# Patient Record
Sex: Female | Born: 1970 | Race: White | Hispanic: No | Marital: Married | Smoking: Never smoker
Health system: Southern US, Community
[De-identification: ages and names within clinical notes are randomized; demographics above are authoritative.]

## PROBLEM LIST (undated history)

## (undated) HISTORY — PX: HAND SURGERY: SHX662

## (undated) HISTORY — PX: BREAST REDUCTION SURGERY: SHX8

## (undated) HISTORY — PX: TONSILLECTOMY: SUR1361

---

## 2009-08-27 ENCOUNTER — Ambulatory Visit
Admit: 2009-08-27 | Discharge: 2009-08-27 | Disposition: A | Discharge status: Home / Self Care | Payer: Self-pay | Source: Ambulatory Visit | Attending: Obstetrics and Gynecology | Admitting: Obstetrics and Gynecology

## 2009-09-01 LAB — HPV DNA PROBE WITH CYTOLOGY: HPV Hybrid Capture: NEGATIVE

## 2009-09-02 LAB — GYN CYTOLOGY

## 2009-09-08 ENCOUNTER — Ambulatory Visit
Admit: 2009-09-08 | Discharge: 2009-09-08 | Disposition: A | Discharge status: Home / Self Care | Payer: Self-pay | Source: Ambulatory Visit | Attending: Obstetrics and Gynecology | Admitting: Obstetrics and Gynecology

## 2009-09-13 LAB — VITAMIN D
25-OH VIT D2: <4 ng/mL
25-OH VIT D3: 21 ng/mL
25-OH Vit Total: 21 ng/mL — ABNORMAL LOW (ref 30–80)

## 2010-09-14 ENCOUNTER — Ambulatory Visit
Admit: 2010-09-14 | Discharge: 2010-09-14 | Disposition: A | Discharge status: Home / Self Care | Payer: Self-pay | Source: Ambulatory Visit | Admitting: Obstetrics and Gynecology

## 2010-09-16 LAB — GYN CYTOLOGY

## 2011-03-07 ENCOUNTER — Ambulatory Visit: Payer: Self-pay | Admitting: Allergy/Immunology/Rheumatology

## 2011-04-07 ENCOUNTER — Ambulatory Visit
Admit: 2011-04-07 | Discharge: 2011-04-07 | Disposition: A | Discharge status: Home / Self Care | Payer: Self-pay | Source: Ambulatory Visit | Attending: Obstetrics and Gynecology | Admitting: Obstetrics and Gynecology

## 2011-04-07 LAB — HIV-1 AND 2 AB: HIV 1&2 Ab screen: NEGATIVE

## 2011-04-07 LAB — TYPE AND SCREEN FOR PNP
ABO RH Blood Type: B POS
Antibody Screen: NEGATIVE

## 2011-04-07 LAB — HEPATITIS C ANTIBODY: Hep C Ab: NEGATIVE

## 2011-04-07 LAB — GLUCOSE: Glucose: 77 mg/dL (ref 60–99)

## 2011-04-08 LAB — AEROBIC CULTURE

## 2011-04-10 LAB — PRENATAL PROFILE
Baso # K/uL: 0 10*3/uL (ref 0.0–0.1)
Basophil %: 0.2 % (ref 0.1–1.2)
Eos # K/uL: 0.1 10*3/uL (ref 0.0–0.4)
Eosinophil %: 1.4 % (ref 0.7–5.8)
HBV S Ag: NEGATIVE
Hematocrit: 35 % (ref 34–45)
Hemoglobin: 12.5 g/dL (ref 11.2–15.7)
Lymph # K/uL: 1.2 10*3/uL (ref 1.2–3.7)
Lymphocyte %: 18.6 % — ABNORMAL LOW (ref 19.3–51.7)
MCV: 84 fL (ref 79–95)
Mono # K/uL: 0.4 10*3/uL (ref 0.2–0.9)
Monocyte %: 7 % (ref 4.7–12.5)
Neut # K/uL: 4.6 10*3/uL (ref 1.6–6.1)
Platelets: 203 10*3/uL (ref 160–370)
RBC: 4.1 MIL/uL (ref 3.9–5.2)
RDW: 12.5 % (ref 11.7–14.4)
Rubella IgG AB: IMMUNE
Seg Neut %: 72.8 % — ABNORMAL HIGH (ref 34.0–71.1)
Syphilis Screen: NEGATIVE
Syphilis Status: NONREACTIVE
WBC: 6.3 10*3/uL (ref 4.0–10.0)

## 2011-04-10 LAB — N. GONORRHOEAE DNA AMPLIFICATION: N. gonorrhoeae DNA Amplification: 0

## 2011-04-10 LAB — VITAMIN D
25-OH VIT D2: <4 ng/mL
25-OH VIT D3: 25 ng/mL
25-OH Vit Total: 25 ng/mL — ABNORMAL LOW (ref 30–80)

## 2011-04-10 LAB — CHLAMYDIA PLASMID DNA AMPLIFICATION: Chlamydia Plasmid DNA Amplification: 0

## 2011-04-11 LAB — LEAD, BLOOD

## 2011-04-11 LAB — LEAD VENOUS: Lead,Venous: <1 ug/dl (ref 0–25)

## 2011-04-24 LAB — CYSTIC FIBROSIS DNA (BAYLOR)

## 2011-05-01 ENCOUNTER — Ambulatory Visit
Admit: 2011-05-01 | Discharge: 2011-05-01 | Disposition: A | Discharge status: Home / Self Care | Payer: Self-pay | Source: Ambulatory Visit | Attending: Obstetrics and Gynecology | Admitting: Obstetrics and Gynecology

## 2011-05-05 LAB — MATERNAL 1ST TRIMESTER SCR (11-13 6/7 WEEKS)
Age at Delivery: 39.3 yrs.
CRL: 73 mm
DS A Priori Risk: 1:85 {titer}
DS Screen Risk: 1:2190 {titer}
HCG MoM: 0.81
NT MoM: 0.84
NT: 1.5 mm
PAPP-A MoM: 0.78
PAPP-A: 2.22 m[IU]/mL
Patient Weight: 138 [lb_av]
T18 A Priori Risk: 1:213 {titer}
T18 Screen Risk: 1:8930 {titer}
hCG: 60453 m[IU]/mL

## 2011-05-30 ENCOUNTER — Ambulatory Visit
Admit: 2011-05-30 | Discharge: 2011-05-30 | Disposition: A | Discharge status: Home / Self Care | Payer: Self-pay | Source: Ambulatory Visit | Attending: Obstetrics and Gynecology | Admitting: Obstetrics and Gynecology

## 2011-05-31 LAB — MATERNAL AFP ONLY (14-22 67 WEEKS)
AFP MoM: 1.44
AFP: 51 IU/mL
Age at Delivery: 39.3 yrs.
OSB Risk: 1:6610 {titer}
Patient Weight: 135 [lb_av]

## 2011-08-14 ENCOUNTER — Ambulatory Visit
Admit: 2011-08-14 | Discharge: 2011-08-14 | Disposition: A | Discharge status: Home / Self Care | Payer: Self-pay | Source: Ambulatory Visit | Attending: Obstetrics and Gynecology | Admitting: Obstetrics and Gynecology

## 2011-08-14 LAB — HEMATOCRIT: Hematocrit: 34 % (ref 34–45)

## 2011-08-14 LAB — GLUCOSE TOLERANCE, 1 HOUR: Glucose,50gm 1HR: 96 mg/dL (ref 63–135)

## 2011-08-15 LAB — SYPHILIS SCREEN
Syphilis Screen: NEGATIVE
Syphilis Status: NONREACTIVE

## 2011-08-15 NOTE — L&D Delivery Note (Addendum)
Delivery Summary Note  Patient: Diane Francis  Age: 41 y.o.  Date of Birth: [birth year]  ZOX:[id number]  MRN: [id number]  Admission Summary  Date and time of admission: 10/22/2011  4:50 AM Attending Provider: Rib, Etta Grandchild, MD Provider Group : [facility] Problems   Diagnoses   . Marland Kitchen*SVD (spontaneous vaginal delivery)   . AMA (advanced maternal age) primigravida 35+     Allergies:   Pcn and Sulfa drugs  Weight:  PrePregnancy Weight: 58.06 kg (128 lb) weight: 69.854 kg (154 lb) Pregnancy weight change (kg): 11.79 kg   Obstetric History    G1   P1   T1   P0   A0   TAB0   SAB0   E0   M0   L0       Breast or Bottle Feeding: Bottle feeding           Prenatal Labs  ABO RH Blood Type   Date Value Range Status   10/22/2011 B RH POS   Final        Antibody Screen   Date Value Range Status   10/22/2011 Negative   Final        Rubella IgG AB   Date Value Range Status   04/07/2011 IMMUNE  IMMUNE Final      TEST METHOD: EIA        HBV S Ag   Date Value Range Status   04/07/2011 NEG   Final        HIV 1&2 Ab screen   Date Value Range Status   04/07/2011 NEG   Final      TEST METHOD: EIA      Dating Information  No LMP recorded. EDD: 11/07/2011, by Patient Reported  Labor Onset  Labor Onset Date: 10/22/11  Labor Onset Time: 0216  Dilation Complete Date: 10/22/11 Dilation Complete Time: 0845    Information for the patient's newborn:  Diane, Francis Diane [[id number]]   Delivery:   Patient: Diane Francis  Sex: female Gestational Age: 73.7 weeks. MRN: [id number] [name], MD *     Birth date [birth year] Time  9:15 AM      Newborn Measurements  Weight:   Height:   Head circumference:   @02 @  *Delivering Personnel     Delivering clinician: Charyl Dancer    Other personnel:      Provider Role    FOSTER, KATINA Resident           *Delivery (Newborn)     Time of Head Delivery  9:15 AM Delivery type: Vaginal, Spontaneous Delivery                Location of delivery: 316        *Anesthesia     Method: Local         *APGARs     Totals: : 8            Color:           1  1          Grimace:      2  2          Breathing:     2  2          Heart rate:    2           Muscle tone: 1  2  Maternal/Newborn Bonding     Skin to Skin bonding appropriate         *Cord Info     Vessels: 3 Vessels Complications: NONE Cord blood disposition: Lab Gases sent? No        *Placenta     Date and time: 10/22/2011  9:24 AM    Removal: Spontaneous Appearance: Intact        *Stages of Labor     Stage 1  Hours: 6 Min: 29    Stage 2  Hours: 0 Min: 30    Stage 3  Hours: 0 Min: 9        *Labor Events         Rupture date: 10/22/11 Rupture type: Spontaneous    Rupture Time  8:04 AM                              *Delivery (Maternal)     Episiotomy: None Repair Done: Yes    Lacerations: 2nd Blood loss (mL): 250    Needle Count: Correct Sponge Count: Correct             41 y.o. G1P0 presented at [redacted]w[redacted]d with active labor.  Progressed to fully dilated. Delivered vigorous female infant over intact perineum weighing 2610 g with APGARs 8/9. Placenta delivered intact with 3 vessel cord. 2nd degree perineal laceration repaired in standard fashion with 3.0 vicryl. EBL 250 cc.  Fundus firm at U-1 after delivery.  Both mother and baby tolerated delivery well.    I was present throughout the entire procedure.    Charyl Dancer, MD

## 2011-08-22 ENCOUNTER — Encounter: Payer: Self-pay | Admitting: Obstetrics and Gynecology

## 2011-10-06 ENCOUNTER — Ambulatory Visit
Admit: 2011-10-06 | Discharge: 2011-10-06 | Disposition: A | Discharge status: Home / Self Care | Payer: Self-pay | Source: Ambulatory Visit | Attending: Obstetrics and Gynecology | Admitting: Obstetrics and Gynecology

## 2011-10-08 ENCOUNTER — Encounter: Payer: Self-pay | Admitting: Gastroenterology

## 2011-10-09 LAB — GROUP B STREP CULTURE WITH SUSCEPTIBILITY: Group B Strep Culture: 0

## 2011-10-22 ENCOUNTER — Encounter: Payer: Self-pay | Admitting: Obstetrics and Gynecology

## 2011-10-22 ENCOUNTER — Inpatient Hospital Stay
Admit: 2011-10-22 | Disposition: A | Discharge status: Home / Self Care | Payer: Self-pay | Source: Ambulatory Visit | Attending: Obstetrics and Gynecology | Admitting: Obstetrics and Gynecology

## 2011-10-22 DIAGNOSIS — IMO0001 Reserved for inherently not codable concepts without codable children: Secondary | ICD-10-CM | POA: Diagnosis present

## 2011-10-22 DIAGNOSIS — Z331 Pregnant state, incidental: Secondary | ICD-10-CM | POA: Diagnosis present

## 2011-10-22 DIAGNOSIS — O09519 Supervision of elderly primigravida, unspecified trimester: Secondary | ICD-10-CM | POA: Diagnosis present

## 2011-10-22 LAB — CBC
Hematocrit: 34 % (ref 34–45)
Hemoglobin: 12.1 g/dL (ref 11.2–15.7)
MCV: 86 fL (ref 79–95)
Platelets: 164 10*3/uL (ref 160–370)
RBC: 4 MIL/uL (ref 3.9–5.2)
RDW: 13.2 % (ref 11.7–14.4)
WBC: 9.7 10*3/uL (ref 4.0–10.0)

## 2011-10-22 LAB — TYPE AND SCREEN
ABO RH Blood Type: B POS
Antibody Screen: NEGATIVE

## 2011-10-22 MED ORDER — DIPHENHYDRAMINE HCL 25 MG PO TABS *I*
25.0000 mg | ORAL_TABLET | Freq: Every evening | ORAL | Status: DC | PRN
Start: 2011-10-22 — End: 2011-10-25

## 2011-10-22 MED ORDER — DOCUSATE SODIUM 100 MG PO CAPS *I*
100.0000 mg | ORAL_CAPSULE | Freq: Two times a day (BID) | ORAL | Status: AC
Start: 2011-10-22 — End: ?

## 2011-10-22 MED ORDER — ACETAMINOPHEN 325 MG PO TABS *I*
650.0000 mg | ORAL_TABLET | ORAL | Status: DC | PRN
Start: 2011-10-22 — End: 2011-10-25

## 2011-10-22 MED ORDER — IBUPROFEN 600 MG PO TABS *I*
600.0000 mg | ORAL_TABLET | Freq: Four times a day (QID) | ORAL | Status: AC | PRN
Start: 2011-10-22 — End: ?

## 2011-10-22 MED ORDER — OXYTOCIN 30 UNITS/500 ML NS *POSTPARTUM* *I*
100.0000 m[IU]/min | INTRAMUSCULAR | Status: DC
Start: 2011-10-22 — End: 2011-10-25
  Administered 2011-10-22: 100 m[IU]/min via INTRAVENOUS

## 2011-10-22 MED ORDER — NALBUPHINE HCL 10 MG/ML IJ SOLN *I*
INTRAMUSCULAR | Status: DC
Start: 2011-10-22 — End: 2011-10-22
  Filled 2011-10-22: qty 15

## 2011-10-22 MED ORDER — LACTATED RINGERS IV SOLN *I*
150.0000 mL/h | INTRAVENOUS | Status: DC
Start: 2011-10-22 — End: 2011-10-22

## 2011-10-22 MED ORDER — IBUPROFEN 600 MG PO TABS *I*
600.0000 mg | ORAL_TABLET | Freq: Four times a day (QID) | ORAL | Status: DC | PRN
Start: 2011-10-22 — End: 2011-10-25
  Administered 2011-10-23 – 2011-10-24 (×3): 600 mg via ORAL
  Filled 2011-10-22 (×3): qty 1

## 2011-10-22 MED ORDER — DOCUSATE SODIUM 100 MG PO CAPS *I*
100.0000 mg | ORAL_CAPSULE | Freq: Two times a day (BID) | ORAL | Status: DC | PRN
Start: 2011-10-22 — End: 2011-10-25
  Administered 2011-10-23 (×2): 100 mg via ORAL
  Filled 2011-10-22 (×2): qty 1

## 2011-10-22 MED ORDER — TETANUS-DIPHTH-ACELL PERT 5-2-15.5 LF-MCG/0.5 IM SUSP *I*
0.5000 mL | INTRAMUSCULAR | Status: DC
Start: 2011-10-22 — End: 2011-10-25

## 2011-10-22 MED ORDER — NALBUPHINE HCL 10 MG/ML IJ SOLN *I*
15.0000 mg | Freq: Once | INTRAMUSCULAR | Status: AC
Start: 2011-10-22 — End: 2011-10-22
  Administered 2011-10-22: 15 mg via INTRAVENOUS

## 2011-10-22 MED ORDER — BENZOCAINE-MENTHOL 20-0.5 % EX AERO *I*
INHALATION_SPRAY | CUTANEOUS | Status: DC | PRN
Start: 2011-10-22 — End: 2011-10-25

## 2011-10-22 NOTE — Progress Notes (Signed)
Pt admitted to the [facility] ambulatory from triage in labor. Pt's prenatal record and fhr strip reviewed. Pt states that she has been contracting since about 2300 on 10/21/11 and contractions have been increasing in intensity. Pt denies LOF or VB. Pt states that she is feeling adequate fetal movement. Pt rating her pain a 7/10 but declines interventions at this time. Dr Vickii Penna in the room to meet pt and discuss plan of care. Plan is for expectant management at this time. Pt agreeable with plan. Pt and husband orientated to the room and unit routine. Call bell within reach and side rails up x2. Report to Kirkland Hun,  RN. Dolores Patty, RN

## 2011-10-22 NOTE — Progress Notes (Signed)
 Writer not present for exam. Daiva Nakayama, RN

## 2011-10-22 NOTE — Discharge Instructions (Signed)
Active Hospital Problems   Diagnoses    SVD (spontaneous vaginal delivery)    AMA (advanced maternal age) primigravida 49+      Resolved Hospital Problems   Diagnoses    Pregnant state, incidental    Active labor       Brief Summary of Your Hospital Course (including key procedures and diagnostic test results):  You had an uncomplicated delivery.    Written instructions provided: Welcome to [facility] your OB provider promptly if you experience any of the symptoms in the pre-written guidelines. If you cannot reach your MD/CNM, call their answering service or 9-1-1.    Diet: per pre-written guidelines.    Activity:  Per pre-written guidelines.    Medical Equipment / Supplies  None    [employer]  None

## 2011-10-22 NOTE — H&P (Addendum)
OB Admission History and Physical    CC: contractions    HPI: 41 y.o. G1P0 at [redacted]w[redacted]d by LMP presents with contractions q5 minutes since 11pm.  No VB or GOF.  Baby active.  Uncomplicated first pregnancy.  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 No pertinent past medical history.     Past Surgical History:  History reviewed. No pertinent past surgical history.     Home Medications:  Prior to Admission medications    Medication Sig Start Date End Date Taking? Authorizing Provider   prenatal multivitamin w/folic acid (PRENAVITE) tablet Take 1 tablet by mouth daily   Yes [provider]        Allergies:  Allergies   Allergen Reactions    Pcn (Penicillins) Swelling    Sulfa Drugs Rash        Social History:  Patient reports that she has never smoked. She does not have any smokeless tobacco history on file. She reports that she currently engages in sexual activity. She reports using the following method of birth control/protection: None. She reports that she does not drink alcohol or use illicit drugs.      Family History:  Family History   Problem Relation Age of Onset    Breast cancer Paternal Grandmother     Hypertension Father     Lupus Mother         Gynecologic History:  STIs   No  abnormal paps No    Review of Systems:      All other ROS negative unless otherwise noted in HPI.     Prenatal Ultrasounds:  Normal anatomic, anterior mid placenta, normal AFV    Prenatal Labs:    Lab Results   Component Value Date/Time    ABORH B RH POS 04/07/2011  9:56 AM    RUB IMMUNE 04/07/2011  9:56 AM    SYPG Neg 08/14/2011 10:40 AM    HIV12 NEG 04/07/2011  9:56 AM    AUS NEG 04/07/2011  9:56 AM    PDG . 04/07/2011  9:30 AM    PDC .  04/07/2011  9:30 AM    1SCRN 96 08/14/2011 10:40 AM     No results found for this basename: gl0, gl1h, gl2h, gl3h        Physical Exam:  Filed Vitals:    10/22/11 0509   Temp: 36.9 C (98.4 F)   Resp: 18       HEENT: Normocephalic; atraumatic  Cardiovascular: normal rate  Respiratory: unlabored breathing  Abdomen: Gravid non-tender  Neurological: grossly intact  Extremities/Skin: No edema noted    Pelvic Exam:   Dilation: 4.5cm   Effacement: 90%   Station: -1      Estimated Fetal Weight: 3400 grams by leopolds    Presentation:  cephalic by ultrasound    Fetal Monitoring:  Baseline: 135 bpm          Variability: Moderate (6-25 BPM)  Pattern: Accelerations  Category: Category I  Toco: q3-5      Assessment/Plan:  41 y.o. G1P0 at [redacted]w[redacted]d with active labor  - Admit to [facility], Dr Ledon Snare  - T/S, syphilis screen and CBC sent  - Insert Hep lock  - Rh pos / Rubella Imm  - GBS negative  - Cervical exam: 4.5/90/-1 / Membranes intact  - Presentation cephalic / EFW 3400g  - Plan for expectant management for now.  Pt would like to defer epidural, might try some nubain    D/w Dr Ledon Snare    Albina Billet, MD  OB/GYN R2  Pager [phone]      I saw and evaluated the patient. I agree with the resident's/fellow's findings and plan of care as documented above.  EFW 6 lbs by Leopolds.  Was told 5 1/5 lbs on ultrasound last week.  Expectant mangement of labor.  OK for nubain.     Arthor Captain, MD

## 2011-10-22 NOTE — Progress Notes (Signed)
OB Intrapartum Note:    Subjective: In to check patient, who reports an increase in pressure.    Vitals:   Filed Vitals:    10/22/11 0800   BP: 124/64   Pulse: 63   Temp: 36.8 C (98.2 F)   Resp: 18   Height:    Weight:        Pelvic Exam:   Dilation: 9cm   Effacement: 100%   Station: 0 to +1    Membranes: SROM for blood-tinged fluid @0804     Electronic Fetal Monitoring:  Baseline: 140 bpm         Variability: Moderate (6-25 BPM)  Pattern: Accelerations  Category: Category I  Toco: Not tracing well, palpating every 2-5  Labor Assessment: Active labor    A/P:  41 y.o. G1P0 at [redacted]w[redacted]d admitted in active labor    1) FHT: Category I  2) Pain: Minimal relief s/p Nubain  3) GBS: positive  4) Labor: Active labor, making good cervical change. Continue expectant management. Anticipating SVD.    Jackalyn Lombard, MD  Ob/Gyn R1  [phone]  10/22/2011  8:20 AM

## 2011-10-22 NOTE — Progress Notes (Signed)
OB Triage Admission: Nursing Note    Gestational age: [redacted]w[redacted]d    Pt arrived to [facility] triage ambulatory with report of contractions that have become more intense and frequent since 0200.     Pt reported medications: PNV    Pt reported complications/medical problems:no PMH; PSH: tonsils, hand surgery, breast reduction    Signed by: Daiva Nakayama, RN

## 2011-10-22 NOTE — Progress Notes (Signed)
Pt transferred to 10-1614 ambulatory. Report given to Rojelio Brenner RN.     Daiva Nakayama, RN

## 2011-10-23 LAB — HEMATOCRIT: Hematocrit: 30 % — ABNORMAL LOW (ref 34–45)

## 2011-10-23 LAB — SYPHILIS SCREEN
Syphilis Screen: NEGATIVE
Syphilis Status: NONREACTIVE

## 2011-10-23 NOTE — Progress Notes (Addendum)
OB Postpartum Progress Note:     Postpartum Day: 1    S: Again attempted to see pt for AM rounds, however, pt not in room. Per RN, pt in NICU to see baby (in NICU for temp instability). Per RN pt doing well with no acute issues overnight, pain adequately controlled, tolerating PO, ambulating and voiding without difficulty.    Vitals:   Filed Vitals:    10/23/11 0420   BP: 114/57   Pulse: 66   Temp: 36.3 C (97.3 F)   Resp: 18   Height:    Weight:        Vitals Range:   BP: (90-187)/(57-155)   Temp:  [36.3 C (97.3 F)-37.3 C (99.1 F)]   Temp src:  [-]   Heart Rate:  [43-124]   Resp:  [18]      Physical Exam:  Exam deferred as pt in NICU. Fundal checks stable per RN.    Labs:    Lab 10/23/11 0622 10/22/11 0730   HCT 30* 34       A/P:  41 y.o. G1P1000 on PPD# 1 s/p normal spontaneous vaginal delivery.  Doing well.  1) Continue routine postpartum care  2) Increase ambulation  3) Infant gender: female  4) Feeding type: bottle  5) PPBC: per attending  6) Rh: positive/Rubella:immune  7) Postpartum HCT/CBC: 30 <-- 34  8) Dispo: Likely d/c home PPD#2    Jackalyn Lombard, MD  Ob/Gyn R1  [phone]  10/23/2011  7:51 AM    I saw and evaluated the patient. I agree with the resident's/fellow's findings and plan of care as documented above. No c/o, baby was sent to NICU last night for temp instability--may need to stay extra day.   AVSS, FF@U -1, nt, ext nt  Stable  RPPC  D/c 1-2 days per baby's status.    Ria Comment, MD

## 2011-10-23 NOTE — Progress Notes (Signed)
OB R1 Progress Note    ITSP for AM rounds, however pt not in room. Will return later to assess.    Jackalyn Lombard, MD  Ob/Gyn R1  [phone]  10/23/2011  6:53 AM

## 2011-10-24 NOTE — Progress Notes (Addendum)
OB Postpartum Progress Note:     Postpartum Day: 2    S: [name] says she's doing very well and slept through some of the night. Her pain has been well-controlled. Had a good day yesterday - tolerated regular meals, ambulated. Has been urinating without difficulty. Lochia has been minimal. Denies HA, CP, SOB.     Vitals:   Filed Vitals:    10/23/11 2350   BP: 110/57   Pulse: 70   Temp: 37.3 C (99.1 F)   Resp: 18   Height:    Weight:        Vitals Range:   BP: (96-121)/(57-62)   Temp:  [36.2 C (97.2 F)-37.3 C (99.1 F)]   Temp src:  [-]   Heart Rate:  [68-74]   Resp:  [18]      Physical Exam:  HEENT: NC/AT  Heart: Regular rate and rhythm, no murmurs  Lungs: Clear breath sounds bilaterally  Abd: Soft, appropriately tender, normoactive BS  Ext: No LE edema   Fundus: Firm at umbilicus     Labs:    Lab 10/23/11 0622 10/22/11 0730   HCT 30* 34       A/P:  41 y.o. G1P1000 on PPD# 2 s/p normal spontaneous vaginal delivery.  Doing well.    1) Continue routine postpartum care  2) Increase ambulation  3) Infant gender: female - "[name]"   4) Feeding type: bottle  5) PPBC: per attending  6) Rh: positive/Rubella:immune  7) Postpartum HCT/CBC: 30 <-- 34  8) Dispo: Likely d/c home tomorrow, 3/13    Tamala Fothergill MD  OB/GYN R1   Pager [phone]          I saw and evaluated the patient. I agree with the resident's/fellow's findings and plan of care as documented above. No c/o, feeling well. Baby out of NICU, may be able to be d/c this pm after 48 hr r/o. Mod lochia. AVSS, FF@U -2, nt, ext nt  Stable  D/c home. PNV/Fe 1 every day. BC d/w pt. Likely Mirena - review more at 6 week check. If baby unable to be d/c, stay until tomorrow. Instructed.    Ria Comment, MD

## 2012-10-25 ENCOUNTER — Encounter: Payer: Self-pay | Admitting: Gastroenterology

## 2012-12-11 ENCOUNTER — Ambulatory Visit
Admit: 2012-12-11 | Discharge: 2012-12-11 | Disposition: A | Discharge status: Home / Self Care | Payer: Self-pay | Source: Ambulatory Visit | Attending: Obstetrics and Gynecology | Admitting: Obstetrics and Gynecology

## 2012-12-19 LAB — HPV DNA PROBE WITH CYTOLOGY: HPV Hybrid Capture: NEGATIVE

## 2012-12-23 LAB — GYN CYTOLOGY

## 2013-03-12 ENCOUNTER — Other Ambulatory Visit: Payer: Self-pay

## 2013-03-12 DIAGNOSIS — Z803 Family history of malignant neoplasm of breast: Secondary | ICD-10-CM

## 2013-03-12 DIAGNOSIS — Z1231 Encounter for screening mammogram for malignant neoplasm of breast: Secondary | ICD-10-CM

## 2013-03-20 ENCOUNTER — Ambulatory Visit
Admit: 2013-03-20 | Discharge: 2013-03-20 | Disposition: A | Discharge status: Home / Self Care | Payer: Self-pay | Source: Ambulatory Visit | Attending: Obstetrics and Gynecology | Admitting: Obstetrics and Gynecology

## 2013-11-19 ENCOUNTER — Ambulatory Visit: Payer: Self-pay | Admitting: Orthopedic Surgery

## 2013-11-19 ENCOUNTER — Other Ambulatory Visit: Payer: Self-pay | Admitting: Orthopedic Surgery

## 2013-11-19 ENCOUNTER — Encounter: Payer: Self-pay | Admitting: Orthopedic Surgery

## 2013-11-19 VITALS — BP 112/68 | Ht 61.5 in | Wt 133.0 lb

## 2013-11-19 DIAGNOSIS — M25559 Pain in unspecified hip: Secondary | ICD-10-CM

## 2013-11-19 NOTE — Progress Notes (Signed)
Patient seen at [facility], a dictated note will follow...

## 2013-11-19 NOTE — Progress Notes (Signed)
Diane Francis, Diane Francis MR #:  [id number]   ACCOUNT #:  [id number] DOB:  Dec 26, 1970   DICTATED BY:  Chaya Jan, PA DATE OF VISIT:  11/19/2013     CHIEF COMPLAINT:  Right lateral-based hip pain.    INTERVAL HISTORY:  Diane Francis states she has been having ongoing right hip pain since November 2014.  She is very active in running, running approximately 3-4 miles 3 days a week.  She states most of the pain has been over the lateral aspect of the hip.  She has not been able to get back running over the last couple months.  She is very active on the treadmill and rarely runs outside.  She states she is usually doing this in the morning before she goes to work, as she is a Garment/textile technologist care physician.  She has been taking anti-inflammatories with no real relief.  She has not had any physical therapy or chiropractic work.  She has never undergone a cortisone injection.  She rates the pain at about a 3/10 today.  She states it is all lateral based.  She has no anterior groin pain.  No pain that radiates down the leg.  No numbness or tingling.  Occasionally, when it is flared up, she will have a lot of discomfort with trying to sleep at night.  She denies limping at this time, denies any abdominal pain, denies any lower back pain.  She denies any numbness or tingling down the leg.     Past medical, surgical, family and social history, medications, allergies, and review of systems were reviewed with the patient today and available per new patient questionnaire.    MEDICAL HISTORY:  None.    MEDICATIONS:  None.    SURGICAL HISTORY:  Tonsillectomy, and she has had 2 previous hand surgeries.    ALLERGIES:  Penicillin and sulfa.  Denies latex allergies.    REVIEW OF SYSTEMS:  Complete review of systems negative for pertinent positives.    PHYSICAL EXAM:  GENERAL:  [name] is a 43 year old female, alert and oriented x3, pleasant mood and affect, and  appears in no acute distress.  SKIN:  Cool and dry to touch, no laceration,  abrasion or lesion.  HEENT:  She is normocephalic, atraumatic.  Normal range of motion of the cervical spine.  Negative head compression test.  Negative Spurling's.  No upper motor neuron signs.  EXTREMITIES:  Distal pulses are intact in both upper and lower extremities.  Full range of motion of both upper extremities.  Deep tendon reflexes are 2+.  ABDOMEN:  Soft, nontender.  MUSCULOSKELETAL:  Pelvis is stable.  No pain over the pubic symphysis or ASIS.  She can flex the hip to 130 degrees, internal rotation to 30, external rotation to 60.  She does have some tenderness of the peritrochanteric region.  No anterior groin pain.  Negative FADIR, symmetrical FABER.  Negative log rolling.  Negative straight-leg raise.  She can flex, abduct and adduct the hip against resistance with good strength and no pain.  Calves are soft and nontender bilaterally.  Distal neurovascular exam is intact.    IMAGING:  X-rays were obtained and personally reviewed by me, showing maintained joint space, no obvious impingement.  She does have center edge angle of approximately 18 degrees on the right.    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  I would like to offer her a trochanteric bursal injection under ultrasound guidance as she has had conservative treatment of activity modification and anti-inflammatories for the last 3 months with no relief.  Also, we will provide her with the name of a local chiropractor for ART, and a script for physical therapy with our hip team for hip, core and quad strengthening and functional running progression.  She will follow up with us on an as needed basis.  I gave her the number to our office if she should have any questions or concerns.  I would like her to check in with us next week to let us know the results of the injection.  If she continues to  struggle, I would like to see her back in the office for a re-evaluation with Dr. Idamae LusherGiordano, and discuss further options.  All questions were invited and answered to patient's satisfaction.       Dictated By:  Chaya Jananiel G Kleehammer, PA      ______________________________  Arvil PersonsBrian D Alie Hardgrove, MD    DGK/MODL  DD:  11/19/2013 13:46:52  DT:  11/19/2013 14:52:15  Job #:  [id number]    cc: Arvil PersonsBrian D Danille Oppedisano, MD

## 2013-11-19 NOTE — Procedures (Signed)
Procedure: Diagnostic ultrasound right Hip.    Examiner : Chaya Jananiel G Mylasia Vorhees, PA    Equipment : [employer], [employer]    CPT Code : 09811,[id number]    ICD- 9 Code : 726.5 Enthesopathy of hip    Indication for the use of musculoskeletal ultrasound:  Ultrasound was utilized as a superior imaging modality to provide real time diagnostic feedback with potential to elicit dynamic changes in pertinent anatomy, and within the guidelines of Medicare.     Technique: With the patient in the decubitus position multiple static and dynamic images were obtained of the right hip using the curvilinear transducer with [employer] Edge. Pertinent images were saved and archived.     Findings:      Lateral Hip: The greater trochanter and trochanteric bursa were visualized without distension. The posterosuperior and lateral facet insertion of the gluteus medius were inspected in both the short and long axis with a mixed echoic appearance. The gluteus minimus tendon was examined at its anterior facet insertion in both the short and long axis with a mixed echoic appearance. There is not retraction of the gluteus medius and/or minimus with resisted muscular activation. Vastus lateralis was inspected and appeared normal. The gluteus maximus insertion was inspected and appears normal. Dynamic examination of the IT Band shows no snapping.     Miscellaneous Findings:  None    Impression : Diagnostic ultrasound of the right hip with findings supporting glut med/min tendinopathy as the leading source of pain and dysfunction .        Procedure :  Ultrasound guided greater trochanteric injection of the left hip     Examiner :  Chaya Jananiel G Cuca Benassi, PA    Equipment : [employer], [employer]    CPT Code : 2956276942, [id number]    ICD- 9 Code : 726.5 Enthesopathy of hip    Indications for the use of musculoskeletal ultrasound:  Ultrasound was utilized to improve needle visualization, injection accuracy, and anatomic localization as supported in the  literature by numerous studies, and within Medicare guidelines.    Erika L. Kathaleen Buryaley, BS, Sarvottam DeBaryBajaj, BE, Clarene CritchleyLeslie J. Bissom, MD, and Azucena KubaBrian J. Cole, MD, MBA. Improving Injection Accuracy of the Elbow, Knee, and Shoulder. Does Injection Site and Imaging Make a Difference? A Systematic Review. The American Journal of Sports Medicine, Vol. 39, No. 3, 2011. DOI: 10.1177/0363546510390610.    Technique: After pre-procedure checks the patient was appropriately positioned. Chlorhexadine scrub prep of the injection site was done. The curvilinear  transducer  was sterilized with a chlorascrub maxi swab stick and sterile gel used.  Ultrasound imagery of the injection site was obtained with the [facility] [employer] and appropriated images were labeled, saved and permanently archived.    Findings : With the patient in the decubitus position the curvilinear transducer was utilized to visualize the right hip greater trochanteric space via an lateral window.  Local anesthesia was provided then via a anterior lateral approach and under continuous ultrasound guidance a 22 gauge 3.5 inch needle was inserted into the greater trochanteric. Ultrasound needle tip placement verification was obtained then 2cc of Celestone and 5cc of  1% Xylocaine was injected.    Impression : Ultrasound guided greater trochanteric injection of the right hip for greater trochanteric pain syndrome

## 2013-12-03 ENCOUNTER — Ambulatory Visit: Payer: Self-pay | Admitting: Orthopedic Surgery

## 2013-12-03 ENCOUNTER — Ambulatory Visit: Payer: Self-pay | Admitting: Rehabilitative and Restorative Service Providers"

## 2013-12-03 DIAGNOSIS — M25559 Pain in unspecified hip: Secondary | ICD-10-CM

## 2013-12-03 NOTE — Progress Notes (Signed)
PT SPORTS REHABILITATION LE EVALUATION        Diagnosis: right hip pain - glut med/min tendonopathy     Onset date:  November 2014  Date of surgery: NA    History    Diane Francis is a 43 y.o. female who is present today for right hip care.   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   Stresses/physical demands of job: sitting, standing, walking  Stresses/physical demands of home: Self Care and Housekeeping  Sport(s): Running 3-4 miles 3-4 times per week  Other: NA  Diagnostic tests: Per report, reviewed    Symptom location: Lateral, right  Relevant symptoms:  Aching, Pain   Symptom frequency: Intermittent  Symptom intensity:  (0 - 10 scale): Now 0 Best 0 Worst 3   Night Pain: No   Restful sleep:   Yes  Morning Pain/Stiffness: Unchanged   Symptoms worsen with: running, crossing leg, walking  Symptoms improve with: rest, stretching   Assistive device:  none  Patients goals for therapy: Return to running      Objective    Observation: WNL  Gait:  FWB, Normal    Lumbar Screen:  Not Tested  Neurologic:  NA    Palpation:  tenderness @ joint  Incision:  NA      ROM/Strength      HIP LEFT RIGHT STRENGTH    PROM AROM PROM AROM Left Right   Flexion   120  5 5   Extension     5 4   Abduction     5 4   IR   20 *  5 4+   ER   55  5 4  *   Adduction     5 5                  KNEE LEFT RIGHT STRENGTH    PROM AROM PROM AROM Left Right   Flexion         Extension                     LE Flexibility  Quadricep:  Good  Hamstring:  Fair  Illiotibial Band:  Fair      Special Tests:   Hip Patrick's / Pearlean BrownieFaber,  negative  Hip Scouring,  negative  Straight Leg Raise,  negative  FADIR,  negative   Knee NA   Ankle NA          Functional:  Patient unable to run > 1 mile      Assessment:    Findings consistent with 43 y.o., female with right hip glut med/min tendonopathy with pain, strength limitations,  functional limitations    Prognosis:  Good   Contraindications/Precautions/Limitation:  Per diagnosis  Short Term Goals: (2 week(s)): Decrease c/o max pain to < 4/10 and Minimal assistance with HEP/ education concepts  Long Term Goals: (2 month(s)): Pain/Sx 0 - minimal, ROM/ flexibility WNL , Restoration of functional strength, Independent with HEP/education , Functional return to ADLs / activites without limitation     Treatment Plan:   Options / plan reviewed with patient:  Yes  Freq 1 times per week for 1 month(s)    Treatment plan inclusive of:   Exercise: AROM, AAROM, Stretching, Progressive Resistive, Aerobic exercise   Manual Techniques:  N/A   Modalities:  Functional/Therapeutic activites per flowsheet, Ther Exercise per flowsheet   Functional: Proprioception/Dynamic stability, Sports specific, Functional rehab    Thank you for referring this patient to [facility].    Jani GravelAmanda Karlsson, PT    Bridge L2  green   Figure 4 str    Lat HS str     Curl up     Band walks Ch[place] SiGreen

## 2013-12-10 ENCOUNTER — Ambulatory Visit: Payer: Self-pay | Admitting: Rehabilitative and Restorative Service Providers"

## 2014-02-23 ENCOUNTER — Other Ambulatory Visit: Payer: Self-pay

## 2014-02-23 DIAGNOSIS — Z1231 Encounter for screening mammogram for malignant neoplasm of breast: Secondary | ICD-10-CM

## 2014-02-23 DIAGNOSIS — Z803 Family history of malignant neoplasm of breast: Secondary | ICD-10-CM

## 2014-03-31 ENCOUNTER — Ambulatory Visit
Admit: 2014-03-31 | Discharge: 2014-03-31 | Disposition: A | Discharge status: Home / Self Care | Payer: Self-pay | Source: Ambulatory Visit | Attending: Obstetrics and Gynecology | Admitting: Obstetrics and Gynecology

## 2014-05-11 ENCOUNTER — Encounter: Payer: Self-pay | Admitting: Rehabilitative and Restorative Service Providers"

## 2014-05-11 DIAGNOSIS — M25551 Pain in right hip: Secondary | ICD-10-CM

## 2014-05-11 NOTE — Progress Notes (Signed)
Sports and Spine Rehabilitation  Discharge Summary      Diane Francis  [id number]  05/11/2014    Diagnosis: right hip lateral based pain     SUMMARY OF TREATMENTS:  Received care for 1 rehabilitation visits    Attendance:  Poor   Compliance:  Poor    The treatment(s) included:  Home program instruction    Treatment Goals:  Unknown  Range of Motion/Flexibility:  Unknown  Strength/Motor Performance:  Unknown  Functional Recovery:  Unknown  Pain Control:  Unknown  Home Program:  inst. in HEP  Other:  NA    REASON FOR DISCHARGE:  Patient did not return for follow-up     Prognosis at time of discharge:  good  Comments:  NA    Discharge planning:  Discussion regarding the maintenance of appropriate exercise and activity as part of a healthy lifestyle.  Optional utilization of Post-Rehab Conditioning Program [facility]) for transition and/or additional exercise/activity support    Thank you for the referral of this patient to [facility].    Jani Gravel, PT

## 2015-02-02 ENCOUNTER — Other Ambulatory Visit: Payer: Self-pay

## 2015-02-02 DIAGNOSIS — Z803 Family history of malignant neoplasm of breast: Secondary | ICD-10-CM

## 2015-02-02 DIAGNOSIS — Z1231 Encounter for screening mammogram for malignant neoplasm of breast: Secondary | ICD-10-CM

## 2015-03-03 ENCOUNTER — Encounter: Payer: Self-pay | Admitting: Radiology

## 2015-04-12 ENCOUNTER — Ambulatory Visit
Admission: RE | Admit: 2015-04-12 | Discharge: 2015-04-12 | Disposition: A | Discharge status: Home / Self Care | Payer: Self-pay | Source: Ambulatory Visit | Attending: Obstetrics and Gynecology | Admitting: Obstetrics and Gynecology

## 2015-09-16 DIAGNOSIS — Z6824 Body mass index (BMI) 24.0-24.9, adult: Secondary | ICD-10-CM | POA: Diagnosis not present

## 2015-09-16 DIAGNOSIS — Z01419 Encounter for gynecological examination (general) (routine) without abnormal findings: Secondary | ICD-10-CM | POA: Diagnosis not present

## 2015-10-04 MED FILL — TRI-PREVIFEM TABLET: 0.18/0.215/ | 84 days supply | Qty: 84 | Fill #0

## 2015-11-18 DIAGNOSIS — H524 Presbyopia: Secondary | ICD-10-CM | POA: Diagnosis not present

## 2015-12-01 ENCOUNTER — Other Ambulatory Visit (INDEPENDENT_AMBULATORY_CARE_PROVIDER_SITE_OTHER): Payer: Self-pay

## 2015-12-01 ENCOUNTER — Other Ambulatory Visit: Payer: Self-pay | Admitting: Family

## 2015-12-01 DIAGNOSIS — E559 Vitamin D deficiency, unspecified: Secondary | ICD-10-CM

## 2015-12-01 LAB — VITAMIN D 25 HYDROXY (VIT D DEFICIENCY, FRACTURES): VITD: 44.97 ng/mL (ref 30.00–100.00)

## 2015-12-28 MED FILL — TRI-PREVIFEM TABLET: 0.18/0.215/ | 84 days supply | Qty: 84 | Fill #1

## 2016-03-14 MED FILL — NORG-EE 0.18-0.215-0.25/0.0: 0.18/0.215/ | 84 days supply | Qty: 84 | Fill #2

## 2016-05-15 ENCOUNTER — Encounter: Payer: Self-pay | Admitting: Radiology

## 2016-05-18 DIAGNOSIS — D2261 Melanocytic nevi of right upper limb, including shoulder: Secondary | ICD-10-CM | POA: Diagnosis not present

## 2016-05-18 DIAGNOSIS — D2271 Melanocytic nevi of right lower limb, including hip: Secondary | ICD-10-CM | POA: Diagnosis not present

## 2016-05-18 DIAGNOSIS — D2262 Melanocytic nevi of left upper limb, including shoulder: Secondary | ICD-10-CM | POA: Diagnosis not present

## 2016-05-18 DIAGNOSIS — L813 Cafe au lait spots: Secondary | ICD-10-CM | POA: Diagnosis not present

## 2016-05-18 DIAGNOSIS — D225 Melanocytic nevi of trunk: Secondary | ICD-10-CM | POA: Diagnosis not present

## 2016-05-18 DIAGNOSIS — L910 Hypertrophic scar: Secondary | ICD-10-CM | POA: Diagnosis not present

## 2016-05-18 DIAGNOSIS — I788 Other diseases of capillaries: Secondary | ICD-10-CM | POA: Diagnosis not present

## 2016-05-18 DIAGNOSIS — L700 Acne vulgaris: Secondary | ICD-10-CM | POA: Diagnosis not present

## 2016-05-22 MED FILL — TRETINOIN 0.025% CREAM: 0.025 | 20 days supply | Qty: 20 | Fill #0

## 2016-06-13 MED FILL — NORG-EE 0.18-0.215-0.25/0.0: 0.18/0.215/ | 84 days supply | Qty: 84 | Fill #3

## 2016-06-15 DIAGNOSIS — Z1231 Encounter for screening mammogram for malignant neoplasm of breast: Secondary | ICD-10-CM | POA: Diagnosis not present

## 2016-07-11 MED FILL — TRETINOIN 0.025% CREAM: 0.025 | 20 days supply | Qty: 20 | Fill #1

## 2016-08-09 MED FILL — TRETINOIN 0.025% CREAM: 0.025 | 20 days supply | Qty: 20 | Fill #2

## 2016-09-06 MED FILL — NORG-EE 0.18-0.215-0.25/0.0: 0.18/0.215/ | 28 days supply | Qty: 28 | Fill #0 | Status: TO

## 2017-03-01 ENCOUNTER — Ambulatory Visit: Payer: Self-pay

## 2017-03-01 DIAGNOSIS — Z411 Encounter for cosmetic surgery: Secondary | ICD-10-CM

## 2017-03-01 NOTE — Progress Notes (Signed)
Encounter for Cosmetic Procedure.

## 2018-05-02 ENCOUNTER — Other Ambulatory Visit: Payer: Self-pay | Admitting: Family Medicine

## 2018-05-02 MED ORDER — NITROGLYCERIN 0.2 MG/HR TD PT24: MEDICATED_PATCH | TRANSDERMAL | 1 refills | Status: DC

## 2018-05-02 NOTE — Progress Notes (Signed)

## 2018-05-07 MED FILL — NITROGLYCERIN 0.2 MG/HR PTC: 0.2 | 28 days supply | Qty: 7 | Fill #0

## 2018-07-30 ENCOUNTER — Other Ambulatory Visit: Payer: Self-pay | Admitting: *Deleted

## 2018-07-30 MED ORDER — PREDNISONE 50 MG PO TABS: ORAL_TABLET | ORAL | 0 refills | Status: DC

## 2018-07-30 MED FILL — predniSONE 50 MG TABS: 50 | 5 days supply | Qty: 5 | Fill #0

## 2018-10-10 ENCOUNTER — Ambulatory Visit (INDEPENDENT_AMBULATORY_CARE_PROVIDER_SITE_OTHER): Payer: No Typology Code available for payment source | Admitting: Family Medicine

## 2018-10-10 ENCOUNTER — Ambulatory Visit (INDEPENDENT_AMBULATORY_CARE_PROVIDER_SITE_OTHER)
Admission: RE | Admit: 2018-10-10 | Discharge: 2018-10-10 | Disposition: A | Discharge status: Home / Self Care | Payer: No Typology Code available for payment source | Source: Ambulatory Visit | Attending: Family Medicine | Admitting: Family Medicine

## 2018-10-10 ENCOUNTER — Ambulatory Visit: Payer: Self-pay

## 2018-10-10 ENCOUNTER — Encounter: Payer: Self-pay | Admitting: Family Medicine

## 2018-10-10 VITALS — BP 120/80

## 2018-10-10 DIAGNOSIS — M25551 Pain in right hip: Secondary | ICD-10-CM

## 2018-10-10 DIAGNOSIS — S76311A Strain of muscle, fascia and tendon of the posterior muscle group at thigh level, right thigh, initial encounter: Secondary | ICD-10-CM

## 2018-10-10 NOTE — Assessment & Plan Note (Signed)
It appears that patient does have more of a piriformis syndrome.  Right side.  Intrasubstance tearing noted on the ultrasound.  X-rays of patient's hip and lumbar spine ordered today.  Discussed the possibility of PRP with her failing all other conservative therapy at this time.  Patient is in agreement with the plan.  Follow-up with me again to have this done in the near future.

## 2018-10-10 NOTE — Progress Notes (Signed)
[facility] [address] [place], [place] [address] Phone: [phone] Subjective:       CC: Right hip pain  [id number]  Vickie [name] is a 48 y.o. female coming in with complaint of right hip pain. Discussed HEP, been stretching for glute and piriformis.  Discussed hip abductors.  Patient has been doing exercises for this but does not seem to be maintaining better.  Now actually having worsening pain even with stretching.  Patient points to more the piriformis and the gluteal area.  No significant radicular symptoms. Patient is also tried nitroglycerin patches as well as prednisone with very minimal benefits.     History reviewed. No pertinent past medical history. History reviewed. No pertinent surgical history. Social History   Socioeconomic History  . Marital status: Married    Spouse name: Not on file  . Number of children: Not on file  . Years of education: Not on file  . Highest education level: Not on file  Occupational History  . Not on file  Social Needs  . Financial resource strain: Not on file  . Food insecurity:    Worry: Not on file    Inability: Not on file  . Transportation needs:    Medical: Not on file    Non-medical: Not on file  Tobacco Use  . Smoking status: Not on file  Substance and Sexual Activity  . Alcohol use: Not on file  . Drug use: Not on file  . Sexual activity: Not on file  Lifestyle  . Physical activity:    Days per week: Not on file    Minutes per session: Not on file  . Stress: Not on file  Relationships  . Social connections:    Talks on phone: Not on file    Gets together: Not on file    Attends religious service: Not on file    Active member of club or organization: Not on file    Attends meetings of clubs or organizations: Not on file    Relationship status: Not on file  Other Topics Concern  . Not on file  Social History Narrative  . Not on file   Not on File History reviewed. No  pertinent family history.  Current Outpatient Medications (Endocrine & Metabolic):  .  predniSONE (DELTASONE) 50 MG tablet, Take 1 tablet daily.  Current Outpatient Medications (Cardiovascular):  .  nitroGLYCERIN (NITRODUR - DOSED IN MG/24 HR) 0.2 mg/hr patch, 1/4 patch daily        Past medical history, social, surgical and family history all reviewed in electronic medical record.  No pertanent information unless stated regarding to the chief complaint.   Review of Systems:  No headache, visual changes, nausea, vomiting, diarrhea, constipation, dizziness, abdominal pain, skin rash, fevers, chills, night sweats, weight loss, swollen lymph nodes, body aches, joint swelling, chest pain, shortness of breath, mood changes.  Positive muscle aches  Objective  Blood pressure 120/80, last menstrual period 09/30/2018.   General: No apparent distress alert and oriented x3 mood and affect normal, dressed appropriately.  HEENT: Pupils equal, extraocular movements intact  Respiratory: Patient's speak in full sentences and does not appear short of breath  Cardiovascular: No lower extremity edema, non tender, no erythema  Skin: Warm dry intact with no signs of infection or rash on extremities or on axial skeleton.  Abdomen: Soft nontender  Neuro: Cranial nerves II through XII are intact, neurovascularly intact in all extremities with 2+ DTRs and  2+ pulses.  Lymph: No lymphadenopathy of posterior or anterior cervical chain or axillae bilaterally.  Gait normal with good balance and coordination.  MSK:  Non tender with full range of motion and good stability and symmetric strength and tone of shoulders, elbows, wrist, knee and ankles bilaterally.  Hip: Right ROM IR: 45 Deg, ER: 20 Deg, Flexion: 120 Deg, Extension: 100 Deg, Abduction: 45 Deg, Adduction: 15 Deg Strength IR: 5/5, ER: 5/5, Flexion: 5/5, Extension: 5/5, Abduction: 5/5, Adduction: 5/5 Pelvic alignment unremarkable to inspection and  palpation. Standing hip rotation and gait without trendelenburg sign / unsteadiness. Pain with Corky Sox test noted.  Patient does have decreased range of motion of external range of motion of the right compared to the left. No SI joint tenderness and normal minimal SI movement.  Limited musculoskeletal ultrasound was performed and interpreted by Lyndal Pulley  Limited ultrasound of patient's hip pain shows piriformis does have what appears to be a fairly large intrasubstance tear near the sciatic nerve.  Mild increase in Doppler flow.  Patient also has a small gluteal tear in the same vicinity.  Very mild greater trochanteric bursitis on the lateral aspect of the hip. Impression: Piriformis tear    Impression and Recommendations:     This case required medical decision making of moderate complexity. The above documentation has been reviewed and is accurate and complete Lyndal Pulley, DO       Note: This dictation was prepared with Dragon dictation along with smaller phrase technology. Any transcriptional errors that result from this process are unintentional.

## 2018-10-18 ENCOUNTER — Ambulatory Visit (INDEPENDENT_AMBULATORY_CARE_PROVIDER_SITE_OTHER): Payer: Self-pay | Admitting: Family Medicine

## 2018-10-18 ENCOUNTER — Ambulatory Visit: Payer: Self-pay

## 2018-10-18 VITALS — BP 120/80

## 2018-10-18 DIAGNOSIS — S76311A Strain of muscle, fascia and tendon of the posterior muscle group at thigh level, right thigh, initial encounter: Secondary | ICD-10-CM

## 2018-10-18 NOTE — Assessment & Plan Note (Signed)
Strain in the piriformis.  Discussed with patient in great length.  Did the PRP injection.  Patient was given the return to exercise progression.  Follow-up again in 3 weeks

## 2018-10-18 NOTE — Progress Notes (Signed)
[facility] [address] [place], [place] [address] Phone: [phone] Subjective:     CC: Right piriformis strain  [id number]  Vickie [name] is a 48 y.o. female coming in with complaint of right piriformis strain.  Seen previously started on nitroglycerin and has had 1 round of prednisone.  Continue doing stretching but never seemed to make significant improvement.  X-rays were ordered and independently visualized by me and lumbar x-rays were unremarkable as well as the right hip.    No past medical history on file. No past surgical history on file. Social History   Socioeconomic History  . Marital status: Married    Spouse name: Not on file  . Number of children: Not on file  . Years of education: Not on file  . Highest education level: Not on file  Occupational History  . Not on file  Social Needs  . Financial resource strain: Not on file  . Food insecurity:    Worry: Not on file    Inability: Not on file  . Transportation needs:    Medical: Not on file    Non-medical: Not on file  Tobacco Use  . Smoking status: Not on file  Substance and Sexual Activity  . Alcohol use: Not on file  . Drug use: Not on file  . Sexual activity: Not on file  Lifestyle  . Physical activity:    Days per week: Not on file    Minutes per session: Not on file  . Stress: Not on file  Relationships  . Social connections:    Talks on phone: Not on file    Gets together: Not on file    Attends religious service: Not on file    Active member of club or organization: Not on file    Attends meetings of clubs or organizations: Not on file    Relationship status: Not on file  Other Topics Concern  . Not on file  Social History Narrative  . Not on file   Not on File No family history on file.  No family history of autoimmune  Current Outpatient Medications (Endocrine & Metabolic):  .  predniSONE (DELTASONE) 50 MG tablet, Take 1 tablet daily.  Current  Outpatient Medications (Cardiovascular):  .  nitroGLYCERIN (NITRODUR - DOSED IN MG/24 HR) 0.2 mg/hr patch, 1/4 patch daily        Past medical history, social, surgical and family history all reviewed in electronic medical record.  No pertanent information unless stated regarding to the chief complaint.   Review of Systems:  No headache, visual changes, nausea, vomiting, diarrhea, constipation, dizziness, abdominal pain, skin rash, fevers, chills, night sweats, weight loss, swollen lymph nodes, body aches, joint swelling, muscle aches, chest pain, shortness of breath, mood changes.   Objective  Blood pressure 120/80, last menstrual period 09/30/2018.   General: No apparent distress alert and oriented x3 mood and affect normal, dressed appropriately.  HEENT: Pupils equal, extraocular movements intact  Respiratory: Patient's speak in full sentences and does not appear short of breath  Cardiovascular: No lower extremity edema, non tender, no erythema  Skin: Warm dry intact with no signs of infection or rash on extremities or on axial skeleton.  Abdomen: Soft nontender  Neuro: Cranial nerves II through XII are intact, neurovascularly intact in all extremities with 2+ DTRs and 2+ pulses.  Lymph: No lymphadenopathy of posterior or anterior cervical chain or axillae bilaterally.  Gait normal with good balance and coordination.  MSK:  Non tender with full range of motion and good stability and symmetric strength and tone of shoulders, elbows, wrist,  knee and ankles bilaterally.  Hip: Right ROM IR: 25 Deg, ER: 45 Deg, Flexion: 120 Deg, Extension: 100 Deg, Abduction: 45 Deg, Adduction: 45 Deg Strength IR: 5/5, ER: 5/5, Flexion: 5/5, Extension: 5/5, Abduction: 5/5, Adduction: 5/5 Pelvic alignment unremarkable to inspection and palpation. Standing hip rotation and gait without trendelenburg sign / unsteadiness. Positive Faber with tenderness over the piriformis and the gluteal area No SI  joint tenderness and normal minimal SI movement.  Procedure: Real-time Ultrasound Guided Injection of  Device: GE Logiq Q7 Ultrasound guided injection is preferred based studies that show increased duration, increased effect, greater accuracy, decreased procedural pain, increased response rate, and decreased cost with ultrasound guided versus blind injection.  Verbal informed consent obtained.  Time-out conducted.  Noted no overlying erythema, induration, or other signs of local infection.  Skin prepped in a sterile fashion.  Local anesthesia: Topical Ethyl chloride.  With sterile technique and under real time ultrasound guidance: With a 21-gauge 2 inch needle patient was injected with 1 cc of 0.5% Marcaine and then injected with 4 cc of pre-centrifuge PRP into the tendon sheath. Completed without difficulty  Pain immediately resolved suggesting accurate placement of the medication.  Advised to call if fevers/chills, erythema, induration, drainage, or persistent bleeding.  Images permanently stored and available for review in the ultrasound unit.  Impression: Technically successful ultrasound guided injection.   Impression and Recommendations:     This case required medical decision making of moderate complexity. The above documentation has been reviewed and is accurate and complete Lyndal Pulley, DO       Note: This dictation was prepared with Dragon dictation along with smaller phrase technology. Any transcriptional errors that result from this process are unintentional.

## 2019-05-14 ENCOUNTER — Other Ambulatory Visit: Payer: Self-pay | Admitting: Occupational Medicine

## 2019-05-15 LAB — CBC WITH DIFFERENTIAL/PLATELET
Absolute Monocytes: 428 cells/uL (ref 200–950)
Basophils Absolute: 20 cells/uL (ref 0–200)
Basophils Relative: 0.5 %
Eosinophils Absolute: 148 cells/uL (ref 15–500)
Eosinophils Relative: 3.7 %
HCT: 41.5 % (ref 38.5–45.0)
Hemoglobin: 14.4 g/dL (ref 13.2–15.5)
Lymphs Abs: 1476 cells/uL (ref 850–3900)
MCH: 30.7 pg (ref 27.0–33.0)
MCHC: 34.7 g/dL (ref 32.0–36.0)
MCV: 88.5 fL (ref 80.0–100.0)
MPV: 9.5 fL (ref 7.5–12.5)
Monocytes Relative: 10.7 %
Neutro Abs: 1928 cells/uL (ref 1500–7800)
Neutrophils Relative %: 48.2 %
Platelets: 252 10*3/uL (ref 140–400)
RBC: 4.69 10*6/uL (ref 4.20–5.10)
RDW: 11.8 % (ref 11.0–15.0)
Total Lymphocyte: 36.9 %
WBC: 4 10*3/uL (ref 3.8–10.8)

## 2019-05-15 LAB — LIPID PANEL
Cholesterol: 204 mg/dL — ABNORMAL HIGH (ref <200)
HDL: 91 mg/dL
LDL Cholesterol (Calc): 99 mg/dL (calc)
Non-HDL Cholesterol (Calc): 113 mg/dL (calc) (ref <130)
Total CHOL/HDL Ratio: 2.2 (calc) (ref <5.0)
Triglycerides: 58 mg/dL (ref <150)

## 2019-05-15 LAB — COMPLETE METABOLIC PANEL WITH GFR
AG Ratio: 2 (calc) (ref 1.0–2.5)
ALT: 14 U/L
AST: 20 U/L (ref 10–40)
Albumin: 4.4 g/dL (ref 3.6–5.1)
Alkaline phosphatase (APISO): 43 U/L
BUN: 17 mg/dL (ref 7–25)
CO2: 29 mmol/L (ref 20–32)
Calcium: 9.6 mg/dL
Chloride: 103 mmol/L (ref 98–110)
Creat: 0.91 mg/dL
Globulin: 2.2 g/dL (calc) (ref 1.9–3.7)
Glucose, Bld: 82 mg/dL (ref 65–99)
Potassium: 5.4 mmol/L — ABNORMAL HIGH (ref 3.5–5.3)
Sodium: 136 mmol/L (ref 135–146)
Total Bilirubin: 0.5 mg/dL (ref 0.2–1.2)
Total Protein: 6.6 g/dL (ref 6.1–8.1)

## 2019-05-15 LAB — PSA: PSA: <0.1 ng/mL

## 2019-05-15 LAB — TSH: TSH: 1.36 mIU/L (ref 0.40–4.50)

## 2019-07-15 ENCOUNTER — Encounter: Payer: Self-pay | Admitting: *Deleted

## 2019-08-28 ENCOUNTER — Other Ambulatory Visit: Payer: Self-pay

## 2019-08-28 ENCOUNTER — Ambulatory Visit (INDEPENDENT_AMBULATORY_CARE_PROVIDER_SITE_OTHER): Payer: 59 | Admitting: Family Medicine

## 2019-08-28 ENCOUNTER — Encounter: Payer: Self-pay | Admitting: Family Medicine

## 2019-08-28 DIAGNOSIS — S76311A Strain of muscle, fascia and tendon of the posterior muscle group at thigh level, right thigh, initial encounter: Secondary | ICD-10-CM | POA: Diagnosis not present

## 2019-08-28 NOTE — Progress Notes (Signed)
[place] Phone: [phone] Subjective:   Vickie [name], am serving as a scribe for Dr. Hulan Saas. This visit occurred during the SARS-CoV-2 public health emergency.  Safety protocols were in place, including screening questions prior to the visit, additional usage of staff PPE, and extensive cleaning of exam room while observing appropriate contact time as indicated for disinfecting solutions.    CC: Right buttocks pain  [id number]  Vickie [name] is a 49 y.o. female coming in with complaint of piriformis syndrome. Feels PRP was successful. Has pain with specific movements such as FABER position. Otherwise wants to discuss plan moving forward.  Patient continues to have some mild discomfort.  Still would state 85% better than where she was initially.  Patient does notice that when she sits a certain way it seems to cause more discomfort and pain [name].     [name]. [name] past surgical history on file. Social History   Socioeconomic History  . Marital status: Married    Spouse name: Not on file  . Number of children: Not on file  . Years of education: Not on file  . Highest education level: Not on file  Occupational History  . Not on file  Tobacco Use  . Smoking status: Not on file  Substance and Sexual Activity  . Alcohol use: Not on file  . Drug use: Not on file  . Sexual activity: Not on file  Other Topics Concern  . Not on file  Social History Narrative  . Not on file   Social Determinants of Health   Financial Resource Strain:   . Difficulty of Paying Living Expenses: Not on file  Food Insecurity:   . Worried About Charity fundraiser in the Last Year: Not on file  . Ran Out of Food in the Last Year: Not on file  Transportation Needs:   . Lack of Transportation (Medical): Not on file  . Lack of Transportation (Non-Medical): Not on file  Physical Activity:   . Days  of Exercise per Week: Not on file  . Minutes of Exercise per Session: Not on file  Stress:   . Feeling of Stress : Not on file  Social Connections:   . Frequency of Communication with Friends and Family: Not on file  . Frequency of Social Gatherings with Friends and Family: Not on file  . Attends Religious Services: Not on file  . Active Member of Clubs or Organizations: Not on file  . Attends Archivist Meetings: Not on file  . Marital Status: Not on file   Allergies  Allergen Reactions  . Penicillins Swelling  . Sulfa Antibiotics Itching   [name] family history on file.  Current Outpatient Medications (Endocrine & Metabolic):  .  predniSONE (DELTASONE) 50 MG tablet, Take 1 tablet daily.  Current Outpatient Medications (Cardiovascular):  .  nitroGLYCERIN (NITRODUR - DOSED IN MG/24 HR) 0.2 mg/hr patch, 1/4 patch daily        Past medical history, social, surgical and family history all reviewed in electronic medical record.  [name] pertanent information unless stated regarding to the chief complaint.   Review of Systems:  [name] headache, visual changes, nausea, vomiting, diarrhea, constipation, dizziness, abdominal pain, skin rash, fevers, chills, night sweats, weight loss, swollen lymph nodes, body aches, joint swelling, muscle aches, chest pain, shortness of breath, mood changes.   Objective  Blood pressure 114/82, pulse Marland Kitchen)  53.    General: [name] apparent distress alert and oriented x3 mood and affect normal, dressed appropriately.  HEENT: Pupils equal, extraocular movements intact  Respiratory: Patient's speak in full sentences and does not appear short of breath  Cardiovascular: [name] lower extremity edema, non tender, [name] erythema  Skin: Warm dry intact with [name] signs of infection or rash on extremities or on axial skeleton.  Abdomen: Soft nontender  Neuro: Cranial nerves II through XII are intact, neurovascularly intact in all extremities with 2+ DTRs and 2+ pulses.    Lymph: [name] lymphadenopathy of posterior or anterior cervical chain or axillae bilaterally.  Gait normal with good balance and coordination.  MSK:  Non tender with full range of motion and good stability and symmetric strength and tone of shoulders, elbows, wrist, , knee and ankles bilaterally.  Hip exam bilaterally shows the patient does have good range of motion.  Still some tightness noted with a right-sided Corky Sox.   Impression and Recommendations:     The above documentation has been reviewed and is accurate and complete Vickie Pulley, DO       Note: This dictation was prepared with Dragon dictation along with smaller phrase technology. Any transcriptional errors that result from this process are unintentional.

## 2019-08-28 NOTE — Patient Instructions (Addendum)
[facility]

## 2019-08-28 NOTE — Assessment & Plan Note (Signed)
Which improved at this time.  Discussed different treatment options including gabapentin regularly, meloxicam for short duration, formal physical therapy, or potential to repeat PRP injections.  Patient has elected to do a trial of the gabapentin.  Patient has some at home.  She will try that first.  Then will get back to [place] and see in a stepwise fashion how to progress.

## 2019-09-15 MED FILL — NORGESTIM-ETH ESTRAD TRIPHA: 0.18/0.215/ | 84 days supply | Qty: 84 | Fill #0

## 2019-10-16 IMAGING — DX DG LUMBAR SPINE COMPLETE 4+V
5 series · 5 of 5 positions shown · non-contrast
Comparison: Right hip radiographs-earlier same day

CLINICAL DATA: Chronic right-sided low back and hip pain. No known
injury.

EXAM:
LUMBAR SPINE - COMPLETE 4+ VIEW

[l-spine ap]
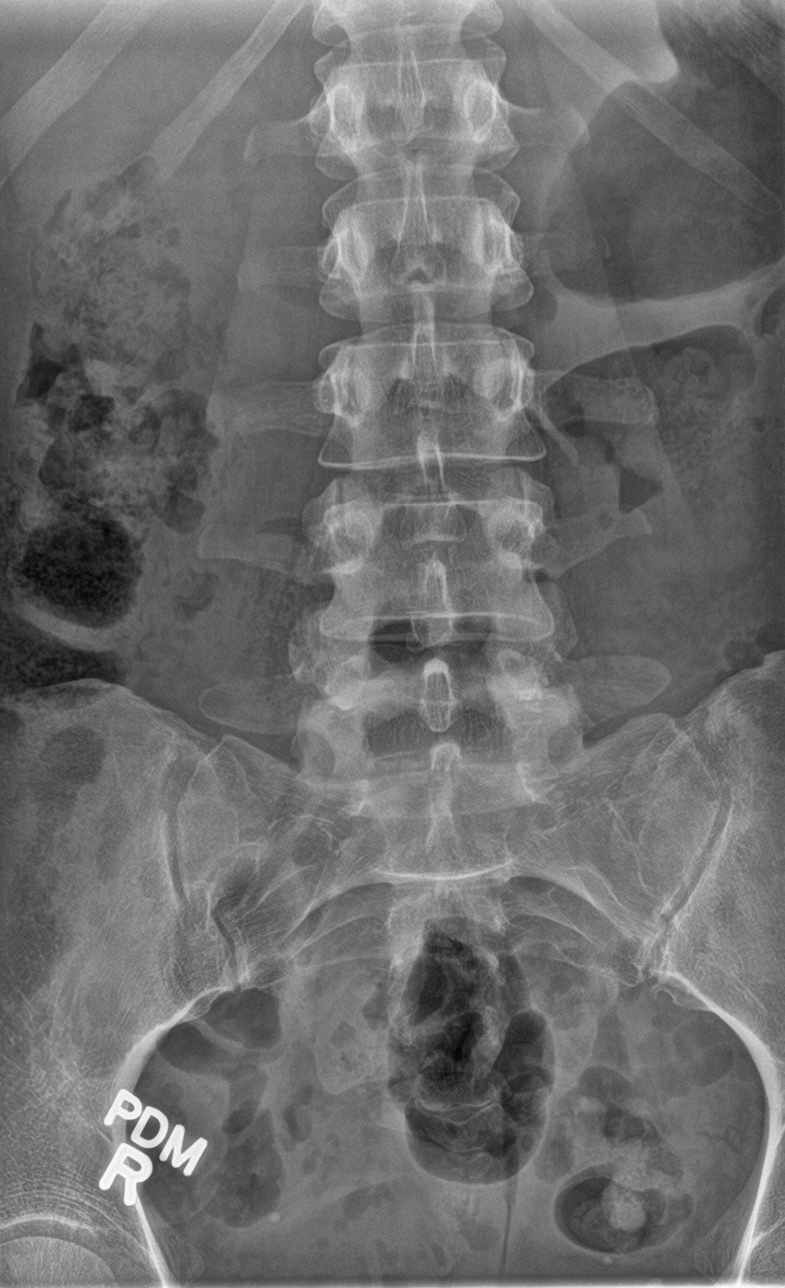

[l-spine obl (1 of 2)]
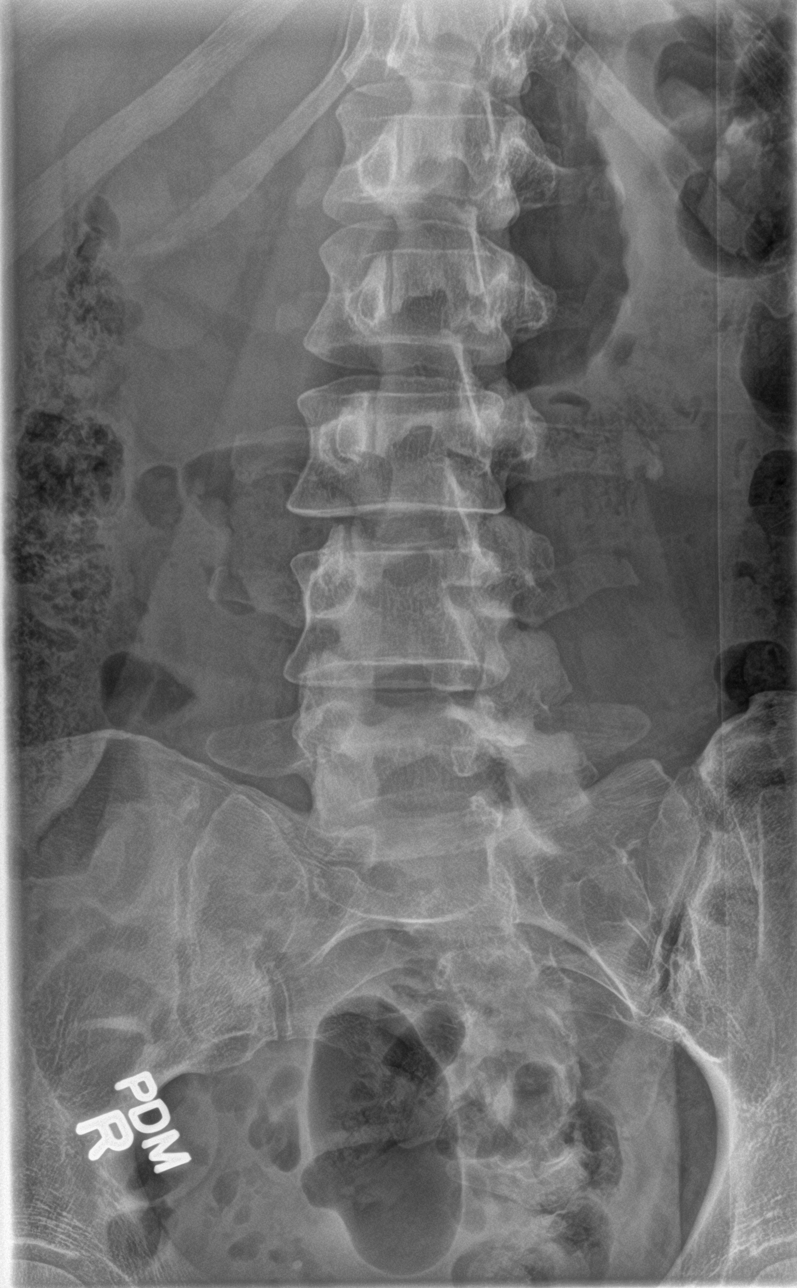

[l-spine obl (2 of 2)]
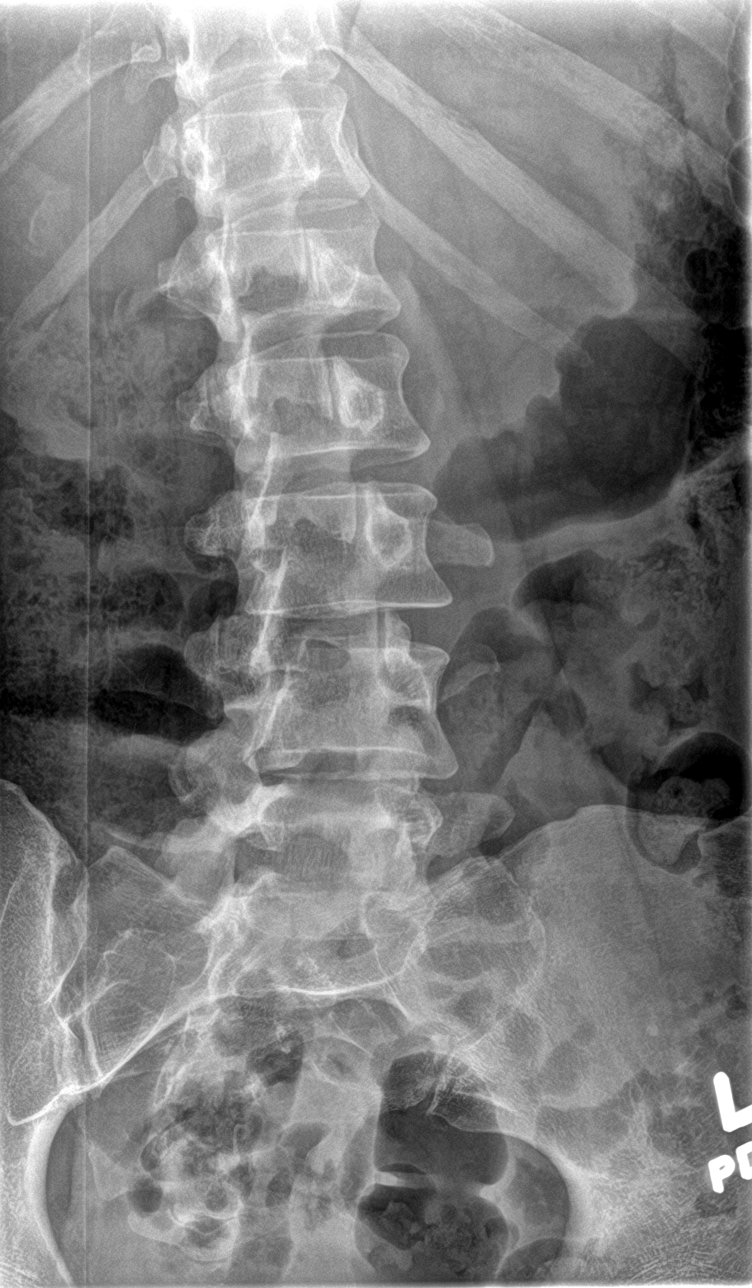

[l-spine lat]
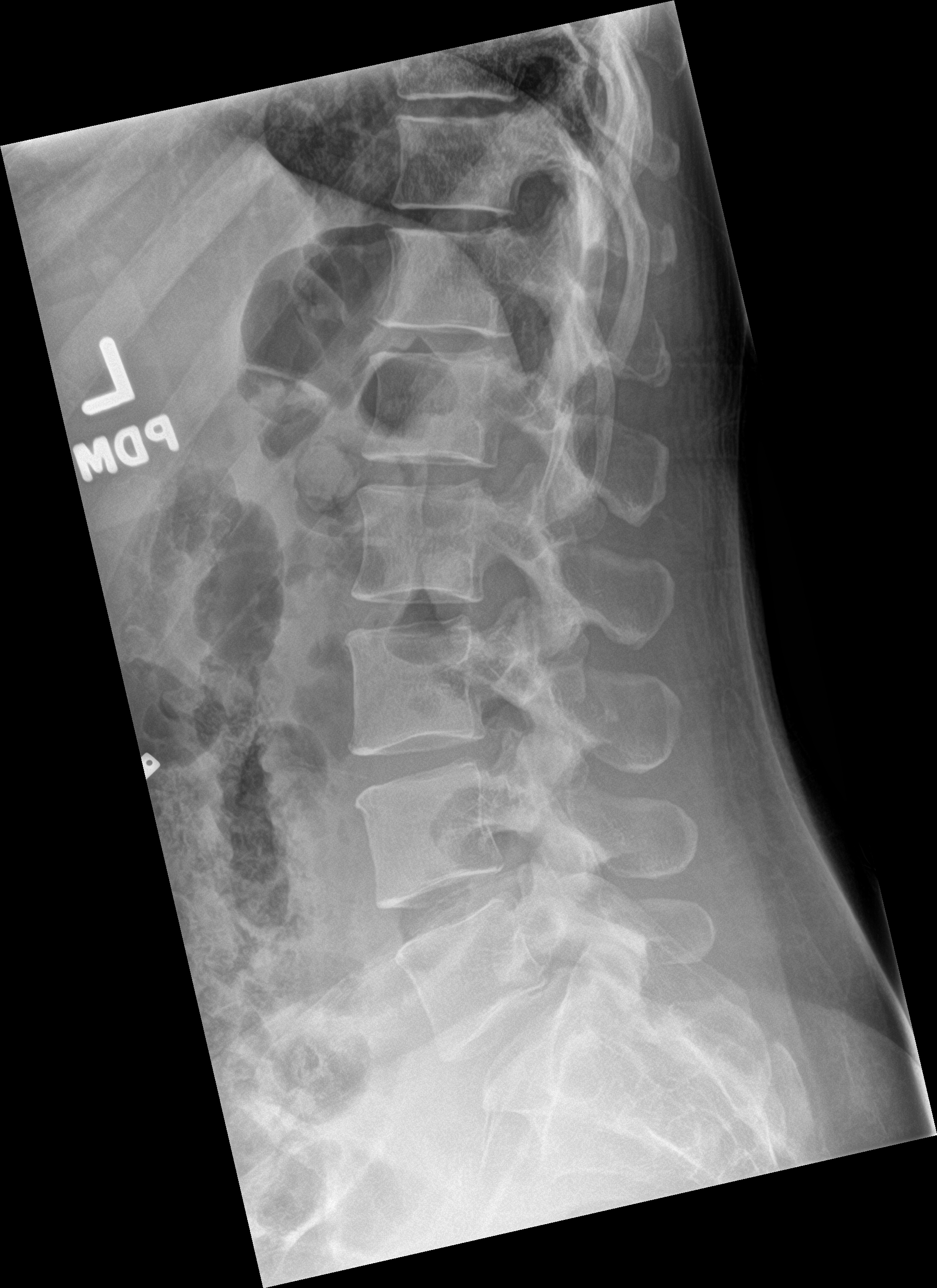

[l-spine spot]
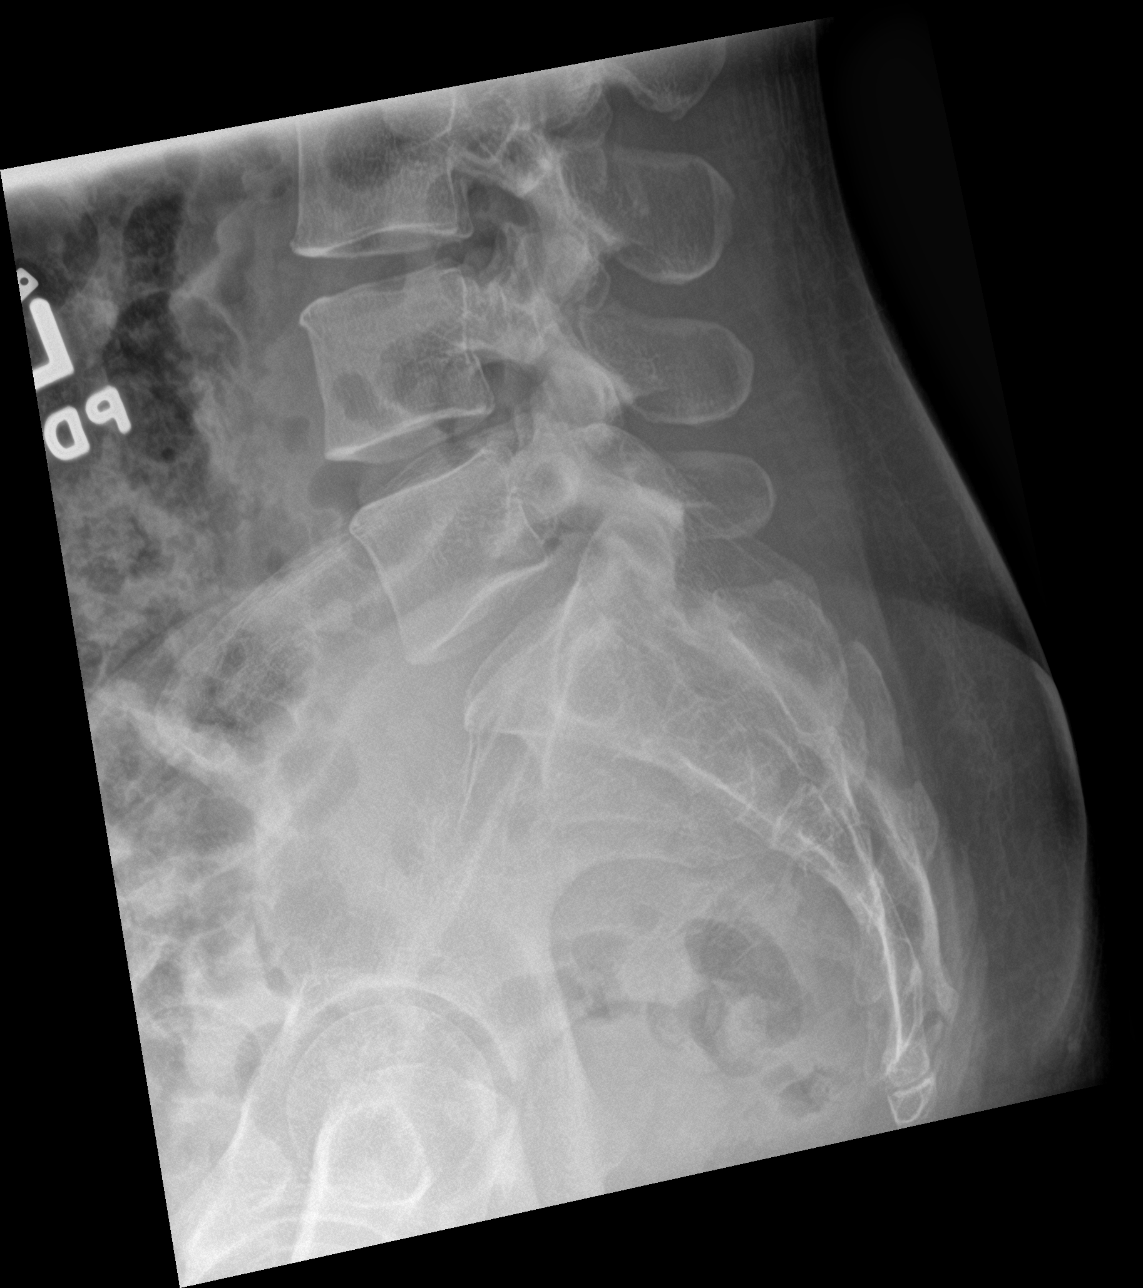

[5 of 5 positions shown; findings below may reference images not displayed]

FINDINGS: There are 5 non rib-bearing lumbar type vertebral bodies.

Normal alignment of the lumbar spine. No anterolisthesis or
retrolisthesis. No pars defects.

Lumbar vertebral body heights are preserved.

Lumbar intervertebral disc space heights are preserved.

Limited visualization of the bilateral SI joints is normal.

Regional bowel gas pattern and soft tissues are normal.
IMPRESSION: Normal lumbar spine radiographs.

## 2019-12-15 MED FILL — NORGESTIM-ETH ESTRAD TRIPHA: 0.18/0.215/ | 84 days supply | Qty: 84 | Fill #1

## 2020-01-08 ENCOUNTER — Other Ambulatory Visit (HOSPITAL_COMMUNITY): Payer: Self-pay | Admitting: Obstetrics & Gynecology

## 2020-01-08 DIAGNOSIS — Z6824 Body mass index (BMI) 24.0-24.9, adult: Secondary | ICD-10-CM | POA: Diagnosis not present

## 2020-01-08 DIAGNOSIS — Z01419 Encounter for gynecological examination (general) (routine) without abnormal findings: Secondary | ICD-10-CM | POA: Diagnosis not present

## 2020-01-08 DIAGNOSIS — L900 Lichen sclerosus et atrophicus: Secondary | ICD-10-CM | POA: Diagnosis not present

## 2020-01-08 MED FILL — CLOBETASOL PROPIONATE 0.05: 0.05 | 7 days supply | Qty: 15 | Fill #0

## 2020-02-05 ENCOUNTER — Other Ambulatory Visit: Payer: Self-pay

## 2020-02-05 ENCOUNTER — Encounter: Payer: Self-pay | Admitting: Family Medicine

## 2020-02-05 ENCOUNTER — Ambulatory Visit: Payer: 59 | Admitting: Family Medicine

## 2020-02-05 NOTE — Progress Notes (Signed)
Patient had an inclusion cyst noted on the fifth finger.  Did not want to do anything different at the moment.  Continue to stay active.  Worsening will consider aspiration

## 2020-02-19 DIAGNOSIS — Z1231 Encounter for screening mammogram for malignant neoplasm of breast: Secondary | ICD-10-CM | POA: Diagnosis not present

## 2020-02-23 ENCOUNTER — Encounter: Payer: Self-pay | Admitting: Internal Medicine

## 2020-02-23 ENCOUNTER — Ambulatory Visit (INDEPENDENT_AMBULATORY_CARE_PROVIDER_SITE_OTHER): Payer: 59 | Admitting: Internal Medicine

## 2020-02-23 DIAGNOSIS — J0110 Acute frontal sinusitis, unspecified: Secondary | ICD-10-CM | POA: Diagnosis not present

## 2020-02-23 DIAGNOSIS — J019 Acute sinusitis, unspecified: Secondary | ICD-10-CM | POA: Insufficient documentation

## 2020-02-23 MED ORDER — AZITHROMYCIN 250 MG PO TABS: ORAL_TABLET | ORAL | 0 refills | Status: DC

## 2020-02-23 MED FILL — AZITHROMYCIN 250 MG TABS: 250 | 5 days supply | Qty: 6 | Fill #0

## 2020-02-23 NOTE — Assessment & Plan Note (Signed)
Rx azithromycin due to pcn allergy. Continue otc allergy medications.

## 2020-02-23 NOTE — Progress Notes (Signed)
   Subjective:   Patient ID: Vickie [name], female    DOB: Dec 04, 1970, 49 y.o.   MRN: [id number]  HPI The patient is a 49 YO female coming in for concerns about ear and sinus pain. Started weeks ago and overall is not improving. Does have some TMJ and eustachian tube dysfunction but feels this is worse than usual. Denies fevers or chills. Denies sinus drainage. Has tried otc allergy medications.  PMH, [facility], social history reviewed and updated  Review of Systems  Constitutional: Negative.   HENT: Positive for ear pain, sinus pressure and sinus pain. Negative for sore throat and trouble swallowing.   Eyes: Negative.   Respiratory: Negative for cough, chest tightness and shortness of breath.   Cardiovascular: Negative for chest pain, palpitations and leg swelling.  Gastrointestinal: Negative for abdominal distention, abdominal pain, constipation, diarrhea, nausea and vomiting.  Musculoskeletal: Negative.   Skin: Negative.   Neurological: Negative.   Psychiatric/Behavioral: Negative.     Objective:  Physical Exam Constitutional:      Appearance: She is well-developed.  HENT:     Head: Normocephalic and atraumatic.     Comments: Frontal sinus pressure    Right Ear: Tympanic membrane normal.     Left Ear: Tympanic membrane normal.  Cardiovascular:     Rate and Rhythm: Normal rate and regular rhythm.  Pulmonary:     Effort: Pulmonary effort is normal. No respiratory distress.     Breath sounds: Normal breath sounds. No wheezing or rales.  Abdominal:     General: Bowel sounds are normal. There is no distension.     Palpations: Abdomen is soft.     Tenderness: There is no abdominal tenderness. There is no rebound.  Musculoskeletal:     Cervical back: Normal range of motion.  Skin:    General: Skin is warm and dry.  Neurological:     Mental Status: She is alert and oriented to person, place, and time.     Coordination: Coordination normal.     Vitals:   02/23/20 1213  BP:  112/72  Pulse: (!) 50  Temp: 98.2 F (36.8 C)  TempSrc: Oral  SpO2: 99%  Weight: 128 lb (58.1 kg)  Height: 5\' 1"  (1.549 m)    This visit occurred during the SARS-CoV-2 public health emergency.  Safety protocols were in place, including screening questions prior to the visit, additional usage of staff PPE, and extensive cleaning of exam room while observing appropriate contact time as indicated for disinfecting solutions.   Assessment & Plan:

## 2020-03-10 MED FILL — NORGESTIM-ETH ESTRAD TRIPHA: 0.18/0.215/ | 84 days supply | Qty: 84 | Fill #2

## 2020-05-13 ENCOUNTER — Other Ambulatory Visit (HOSPITAL_COMMUNITY): Payer: Self-pay | Admitting: Dermatology

## 2020-05-13 DIAGNOSIS — D2271 Melanocytic nevi of right lower limb, including hip: Secondary | ICD-10-CM | POA: Diagnosis not present

## 2020-05-13 DIAGNOSIS — D1801 Hemangioma of skin and subcutaneous tissue: Secondary | ICD-10-CM | POA: Diagnosis not present

## 2020-05-13 DIAGNOSIS — D2261 Melanocytic nevi of right upper limb, including shoulder: Secondary | ICD-10-CM | POA: Diagnosis not present

## 2020-05-13 DIAGNOSIS — L813 Cafe au lait spots: Secondary | ICD-10-CM | POA: Diagnosis not present

## 2020-05-13 DIAGNOSIS — D2262 Melanocytic nevi of left upper limb, including shoulder: Secondary | ICD-10-CM | POA: Diagnosis not present

## 2020-05-13 DIAGNOSIS — I788 Other diseases of capillaries: Secondary | ICD-10-CM | POA: Diagnosis not present

## 2020-05-13 DIAGNOSIS — L821 Other seborrheic keratosis: Secondary | ICD-10-CM | POA: Diagnosis not present

## 2020-05-13 DIAGNOSIS — D225 Melanocytic nevi of trunk: Secondary | ICD-10-CM | POA: Diagnosis not present

## 2020-05-13 DIAGNOSIS — D2272 Melanocytic nevi of left lower limb, including hip: Secondary | ICD-10-CM | POA: Diagnosis not present

## 2020-05-18 MED FILL — CLOBETASOL PROPIONATE 0.05: 0.05 | 7 days supply | Qty: 15 | Fill #0

## 2020-05-18 MED FILL — TRETINOIN 0.05 % CREA: 0.05 | 10 days supply | Qty: 20 | Fill #0

## 2020-06-08 MED FILL — NORGESTIM-ETH ESTRAD TRIPHA: 0.18/0.215/ | 84 days supply | Qty: 84 | Fill #0

## 2020-08-24 MED FILL — NORGESTIM-ETH ESTRAD TRIPHA: 0.18/0.215/ | 84 days supply | Qty: 84 | Fill #1

## 2020-09-21 DIAGNOSIS — Z20822 Contact with and (suspected) exposure to covid-19: Secondary | ICD-10-CM | POA: Diagnosis not present

## 2020-11-11 DIAGNOSIS — H524 Presbyopia: Secondary | ICD-10-CM | POA: Diagnosis not present

## 2020-11-21 MED FILL — Norgestimate-Eth Estrad Tab 0.18-35/0.215-35/0.25-35 MG-MCG: ORAL | 84 days supply | Qty: 84 | Fill #0 | Status: AC

## 2020-11-22 ENCOUNTER — Other Ambulatory Visit (HOSPITAL_COMMUNITY): Payer: Self-pay

## 2020-11-24 ENCOUNTER — Other Ambulatory Visit (HOSPITAL_COMMUNITY): Payer: Self-pay

## 2021-02-14 ENCOUNTER — Other Ambulatory Visit (HOSPITAL_COMMUNITY): Payer: Self-pay

## 2021-02-16 ENCOUNTER — Other Ambulatory Visit (HOSPITAL_COMMUNITY): Payer: Self-pay

## 2021-02-17 ENCOUNTER — Other Ambulatory Visit (HOSPITAL_COMMUNITY): Payer: Self-pay

## 2021-02-17 MED ORDER — NORGESTIM-ETH ESTRAD TRIPHASIC 0.18/0.215/0.25 MG-35 MCG PO TABS
1.0000 | ORAL_TABLET | Freq: Every day | ORAL | 3 refills | Status: DC
  Filled 2021-02-17: qty 84, 84d supply, fill #0
  Filled 2021-05-08: qty 84, 84d supply, fill #1
  Filled 2021-08-01: qty 84, 84d supply, fill #2
  Filled 2021-10-24: qty 84, 84d supply, fill #3

## 2021-03-10 ENCOUNTER — Other Ambulatory Visit (HOSPITAL_COMMUNITY): Payer: Self-pay

## 2021-03-10 DIAGNOSIS — Z6824 Body mass index (BMI) 24.0-24.9, adult: Secondary | ICD-10-CM | POA: Diagnosis not present

## 2021-03-10 DIAGNOSIS — Z1231 Encounter for screening mammogram for malignant neoplasm of breast: Secondary | ICD-10-CM | POA: Diagnosis not present

## 2021-03-10 DIAGNOSIS — Z01419 Encounter for gynecological examination (general) (routine) without abnormal findings: Secondary | ICD-10-CM | POA: Diagnosis not present

## 2021-03-10 MED ORDER — NORGESTIM-ETH ESTRAD TRIPHASIC 0.18/0.215/0.25 MG-35 MCG PO TABS
1.0000 | ORAL_TABLET | Freq: Every day | ORAL | 3 refills | Status: DC
  Filled 2021-03-10: qty 84, 84d supply, fill #0

## 2021-05-09 ENCOUNTER — Other Ambulatory Visit (HOSPITAL_COMMUNITY): Payer: Self-pay

## 2021-05-10 ENCOUNTER — Other Ambulatory Visit (HOSPITAL_COMMUNITY): Payer: Self-pay

## 2021-05-26 ENCOUNTER — Ambulatory Visit (INDEPENDENT_AMBULATORY_CARE_PROVIDER_SITE_OTHER): Payer: 59 | Admitting: Family Medicine

## 2021-05-26 ENCOUNTER — Encounter: Payer: Self-pay | Admitting: Family Medicine

## 2021-05-26 ENCOUNTER — Other Ambulatory Visit: Payer: Self-pay

## 2021-05-26 DIAGNOSIS — D2271 Melanocytic nevi of right lower limb, including hip: Secondary | ICD-10-CM | POA: Diagnosis not present

## 2021-05-26 DIAGNOSIS — D2261 Melanocytic nevi of right upper limb, including shoulder: Secondary | ICD-10-CM | POA: Diagnosis not present

## 2021-05-26 DIAGNOSIS — D2262 Melanocytic nevi of left upper limb, including shoulder: Secondary | ICD-10-CM | POA: Diagnosis not present

## 2021-05-26 DIAGNOSIS — D2272 Melanocytic nevi of left lower limb, including hip: Secondary | ICD-10-CM | POA: Diagnosis not present

## 2021-05-26 DIAGNOSIS — L821 Other seborrheic keratosis: Secondary | ICD-10-CM | POA: Diagnosis not present

## 2021-05-26 DIAGNOSIS — M9904 Segmental and somatic dysfunction of sacral region: Secondary | ICD-10-CM | POA: Diagnosis not present

## 2021-05-26 DIAGNOSIS — M545 Low back pain, unspecified: Secondary | ICD-10-CM | POA: Insufficient documentation

## 2021-05-26 DIAGNOSIS — D1801 Hemangioma of skin and subcutaneous tissue: Secondary | ICD-10-CM | POA: Diagnosis not present

## 2021-05-26 DIAGNOSIS — L813 Cafe au lait spots: Secondary | ICD-10-CM | POA: Diagnosis not present

## 2021-05-26 NOTE — Assessment & Plan Note (Signed)
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  Responded extremely well to osteopathic manipulation.  Discussed icing regimen and home exercises.  Increase activity slowly.  Follow-up again

## 2021-05-26 NOTE — Progress Notes (Signed)
  [place] Phone: [phone] Subjective:    I'm seeing this patient by the request  of:  Patient, No Pcp Per (Inactive)  CC: Low back pain  PPJ:[id number]  Vickie [name] is a 50 y.o. female coming in with complaint of low back pain on the right side.  Patient states it is more around the right sacroiliac joint.  No radiation of the leg, no numbness or tingling.  No weakness.     No past medical history on file. No past surgical history on file. Social History   Socioeconomic History   Marital status: Married    Spouse name: Not on file   Number of children: Not on file   Years of education: Not on file   Highest education level: Not on file  Occupational History   Not on file  Tobacco Use   Smoking status: Not on file   Smokeless tobacco: Not on file  Substance and Sexual Activity   Alcohol use: Not on file   Drug use: Not on file   Sexual activity: Not on file  Other Topics Concern   Not on file  Social History Narrative   Not on file   Social Determinants of Health   Financial Resource Strain: Not on file  Food Insecurity: Not on file  Transportation Needs: Not on file  Physical Activity: Not on file  Stress: Not on file  Social Connections: Not on file   Allergies  Allergen Reactions   Penicillins Swelling   Sulfa Antibiotics Itching   No family history on file.  Current Outpatient Medications (Endocrine & Metabolic):    Norgestimate-Ethinyl Estradiol Triphasic 0.18/0.215/0.25 MG-35 MCG tablet, TAKE 1 TABLET BY MOUTH ONCE DAILY   Norgestimate-Ethinyl Estradiol Triphasic 0.18/0.215/0.25 MG-35 MCG tablet, TAKE 1 TABLET BY MOUTH ONCE DAILY   Norgestimate-Ethinyl Estradiol Triphasic 0.18/0.215/0.25 MG-35 MCG tablet, TAKE 1 TABLET BY MOUTH ONCE DAILY      Current Outpatient Medications (Other):    azithromycin (ZITHROMAX) 250 MG tablet, Day 1 take 2 pills, days 2-5 take 1 pill  daily     Objective  Height 5\' 1"  (1.549 m), weight 133 lb (60.3 kg).   General: No apparent distress alert and oriented x3 mood and affect normal, dressed appropriately.  HEENT: Pupils equal, extraocular movements intact  Respiratory: Patient's speak in full sentences and does not appear short of breath  Cardiovascular: No lower extremity edema, non tender, no erythema  Gait normal with good balance and coordination.  MSK: Low back exam does have some mild loss of lordosis tightness noted of the right sacroiliac joint.  Positive Corky Sox.  Negative straight leg test.  5 out of 5 strength in lower extremities.  Osteopathic findings Sacrum right on right    Impression and Recommendations:     The above documentation has been reviewed and is accurate and complete Lyndal Pulley, DO

## 2021-08-01 ENCOUNTER — Other Ambulatory Visit (HOSPITAL_COMMUNITY): Payer: Self-pay

## 2021-10-24 ENCOUNTER — Other Ambulatory Visit (HOSPITAL_COMMUNITY): Payer: Self-pay

## 2022-01-16 ENCOUNTER — Other Ambulatory Visit (HOSPITAL_COMMUNITY): Payer: Self-pay

## 2022-01-16 MED ORDER — NORGESTIM-ETH ESTRAD TRIPHASIC 0.18/0.215/0.25 MG-35 MCG PO TABS
1.0000 | ORAL_TABLET | Freq: Every day | ORAL | 0 refills | Status: DC
  Filled 2022-01-16: qty 84, 84d supply, fill #0

## 2022-03-02 ENCOUNTER — Encounter: Payer: Self-pay | Admitting: Gastroenterology

## 2022-03-21 NOTE — Progress Notes (Unsigned)
Vickie Vickie [name] Phone: [phone] Subjective:   Vickie Vickie [name], am serving as a scribe for Dr. Hulan [name].  I'm seeing this patient by the request  of:  Patient, Vickie [name] Pcp Per  CC: back pain   LHT:[id number]  Vickie Vickie [name] is a 51 y.o. female coming in with complaint of back pain. Last seen Oct 2022. Patient states that she is having lower back pain that is intermittent. Denies any radiating symptoms. Does feel that her posture is a contributing factor to her back pain as she sits most of her day. Relief from OMT lasts about 2 days.     Vickie [name]. Past Surgical History:  Procedure Laterality Date   BREAST REDUCTION SURGERY     HAND SURGERY Right    TONSILLECTOMY     Social History   Socioeconomic History   Marital status: Married    Spouse name: Not on file   Number of children: Not on file   Years of education: Not on file   Highest education level: Not on file  Occupational History   Not on file  Tobacco Use   Smoking status: Never   Smokeless tobacco: Never  Substance and Sexual Activity   Alcohol use: Yes    Comment: social   Drug use: Never   Sexual activity: Not on file  Other Topics Concern   Not on file  Social History Narrative   Not on file   Social Determinants of Health   Financial Resource Strain: Not on file  Food Insecurity: Not on file  Transportation Needs: Not on file  Physical Activity: Not on file  Stress: Not on file  Social Connections: Not on file   Allergies  Allergen Reactions   Penicillins Swelling   Sulfa Antibiotics Itching   Family History  Problem Relation Age of Onset   Colon cancer Neg Hx    Colon polyps Neg Hx    Esophageal cancer Neg Hx    Rectal cancer Neg Hx    Stomach cancer Neg Hx     Current Outpatient Medications (Endocrine & Metabolic):    Norgestimate-Ethinyl Estradiol Triphasic (TRI-VYLIBRA) 0.18/0.215/0.25 MG-35 MCG  tablet, TAKE 1 TABLET BY MOUTH ONCE DAILY   Norgestimate-Ethinyl Estradiol Triphasic 0.18/0.215/0.25 MG-35 MCG tablet, TAKE 1 TABLET BY MOUTH ONCE DAILY   Norgestimate-Ethinyl Estradiol Triphasic 0.18/0.215/0.25 MG-35 MCG tablet, TAKE 1 TABLET BY MOUTH ONCE DAILY      Current Outpatient Medications (Other):    Na Sulfate-K Sulfate-Mg Sulf 17.5-3.13-1.6 GM/177ML SOLN, Take 1 kit by mouth as directed.   Reviewed prior external information including notes and imaging from  primary care provider As well as notes that were available from care everywhere and other healthcare systems.  Past medical history, social, surgical and family history all reviewed in electronic medical record.  Vickie [name] pertanent information unless stated regarding to the chief complaint.   Review of Systems:  Vickie [name] headache, visual changes, nausea, vomiting, diarrhea, constipation, dizziness, abdominal pain, skin rash, fevers, chills, night sweats, weight loss, swollen lymph nodes, body aches, joint swelling, chest pain, shortness of breath, mood changes. POSITIVE muscle aches  Objective  Blood pressure 110/72, pulse (!) 54, height 5' 1"  (1.549 m), last menstrual period 03/13/2022, SpO2 98 %.   General: Vickie [name] apparent distress alert and oriented x3 mood and affect normal, dressed appropriately.  HEENT: Pupils equal, extraocular movements intact  Respiratory: Patient's speak in full sentences and does  not appear short of breath  Cardiovascular: Vickie [name] lower extremity edema, non tender, Vickie [name] erythema  Back exam shows the patient does have tenderness around the right sacroiliac joint.  Tender to palpation in this area.  Patient though does have good range of motion.  Very mild weakness of hip abductor on the right compared to left.  Good range of motion though noted.  Neurovascular intact distally  Osteopathic findings  T9 extended rotated and side bent left L3 flexed rotated and side bent right Sacrum right on right      Impression and Recommendations:    The above documentation has been reviewed and is accurate and complete Vickie Pulley, DO

## 2022-03-22 ENCOUNTER — Ambulatory Visit (AMBULATORY_SURGERY_CENTER): Payer: Self-pay

## 2022-03-22 ENCOUNTER — Other Ambulatory Visit (HOSPITAL_COMMUNITY): Payer: Self-pay

## 2022-03-22 VITALS — Ht 61.0 in | Wt 130.0 lb

## 2022-03-22 DIAGNOSIS — Z1211 Encounter for screening for malignant neoplasm of colon: Secondary | ICD-10-CM

## 2022-03-22 MED ORDER — NA SULFATE-K SULFATE-MG SULF 17.5-3.13-1.6 GM/177ML PO SOLN
1.0000 | ORAL | 0 refills | Status: DC
  Filled 2022-03-22: qty 354, 1d supply, fill #0

## 2022-03-22 NOTE — Progress Notes (Signed)
No egg or soy allergy known to patient  No issues known to pt with past sedation with any surgeries or procedures Patient denies ever being told they had issues or difficulty with intubation  No FH of Malignant Hyperthermia Pt is not on diet pills Pt is not on  home 02  Pt is not on blood thinners  Pt denies issues with constipation  No A fib or A flutter Have any cardiac testing pending--denied Pt instructed to use Single[employer]com or GoodRx for a price reduction on prep   

## 2022-03-23 ENCOUNTER — Ambulatory Visit (INDEPENDENT_AMBULATORY_CARE_PROVIDER_SITE_OTHER): Payer: 59 | Admitting: Family Medicine

## 2022-03-23 DIAGNOSIS — M545 Low back pain, unspecified: Secondary | ICD-10-CM

## 2022-03-23 DIAGNOSIS — M9904 Segmental and somatic dysfunction of sacral region: Secondary | ICD-10-CM | POA: Diagnosis not present

## 2022-03-23 NOTE — Patient Instructions (Signed)
[place], [employer] after walking Stay hydrated You know where I am if you need me Check back with me in 6 weeks (off the books)

## 2022-03-23 NOTE — Assessment & Plan Note (Signed)
   Decision today to treat with OMT was based on Physical Exam  After verbal consent patient was treated with HVLA,  techniques in  thoracic, lumbar and sacral areas, all areas are chronic   Patient tolerated the procedure well with improvement in symptoms  Patient given exercises, stretches and lifestyle modifications  See medications in patient instructions if given  Patient will follow up in 4-8 weeks

## 2022-03-23 NOTE — Assessment & Plan Note (Signed)
Low back pain is likely more multifactorial.  Patient is responding well to osteopathic manipulation.  We discussed with patient that some core strengthening exercises.  We have discussed the possibility of having this done every 6 weeks for maintenance aspect.  Could consider some other medications but at the moment patient would like to try to avoid it if possible.  X-rays previously and 2 years ago were completely unremarkable and we will continue to monitor and see if any other imaging will be needed

## 2022-03-24 ENCOUNTER — Other Ambulatory Visit (HOSPITAL_COMMUNITY): Payer: Self-pay

## 2022-04-06 ENCOUNTER — Other Ambulatory Visit (HOSPITAL_COMMUNITY): Payer: Self-pay

## 2022-04-06 MED ORDER — NORGESTIM-ETH ESTRAD TRIPHASIC 0.18/0.215/0.25 MG-35 MCG PO TABS
1.0000 | ORAL_TABLET | Freq: Every day | ORAL | 0 refills | Status: DC
  Filled 2022-04-06: qty 84, 84d supply, fill #0

## 2022-04-07 ENCOUNTER — Encounter: Payer: Self-pay | Admitting: Gastroenterology

## 2022-04-10 ENCOUNTER — Other Ambulatory Visit (HOSPITAL_COMMUNITY): Payer: Self-pay

## 2022-04-21 ENCOUNTER — Ambulatory Visit (AMBULATORY_SURGERY_CENTER): Payer: 59 | Admitting: Gastroenterology

## 2022-04-21 ENCOUNTER — Encounter: Payer: Self-pay | Admitting: Gastroenterology

## 2022-04-21 VITALS — BP 94/50 | HR 56 | Temp 98.0°F | Resp 12 | Ht 61.0 in | Wt 130.0 lb

## 2022-04-21 DIAGNOSIS — Z1211 Encounter for screening for malignant neoplasm of colon: Secondary | ICD-10-CM

## 2022-04-21 MED ORDER — SODIUM CHLORIDE 0.9 % IV SOLN: 500.0000 mL | Freq: Once | INTRAVENOUS | Status: DC

## 2022-04-21 NOTE — Progress Notes (Signed)
   Referring Provider: Thornton Park, MD Primary Care Physician:  Patient, No Pcp Per    Indication for Colonoscopy:  Colon cancer screening   IMPRESSION:  Need for colon cancer screening Appropriate candidate for monitored anesthesia care  PLAN: Colonoscopy in the [facility] today   HPI: BRITNI DRISCOLL is a 51 y.o. female presents for screening colonoscopy.  No prior colonoscopy or colon cancer screening.  No known family history of colon cancer or polyps. No family history of uterine/endometrial cancer, pancreatic cancer or gastric/stomach cancer.   History reviewed. No pertinent past medical history.  Past Surgical History:  Procedure Laterality Date   BREAST REDUCTION SURGERY     HAND SURGERY Right    TONSILLECTOMY      Current Outpatient Medications  Medication Sig Dispense Refill   Norgestimate-Ethinyl Estradiol Triphasic (TRI-VYLIBRA) 0.18/0.215/0.25 MG-35 MCG tablet TAKE 1 TABLET BY MOUTH ONCE DAILY 84 tablet 0   Norgestimate-Ethinyl Estradiol Triphasic 0.18/0.215/0.25 MG-35 MCG tablet TAKE 1 TABLET BY MOUTH ONCE DAILY (Patient not taking: Reported on 04/21/2022) 84 tablet 3   Current Facility-Administered Medications  Medication Dose Route Frequency Provider Last Rate Last Admin   0.9 %  sodium chloride infusion  500 mL Intravenous Once Thornton Park, MD        Allergies as of 04/21/2022 - Review Complete 04/21/2022  Allergen Reaction Noted   Penicillins Swelling 07/15/2019   Sulfa antibiotics Itching 07/15/2019    Family History  Problem Relation Age of Onset   Colon cancer Neg Hx    Colon polyps Neg Hx    Esophageal cancer Neg Hx    Rectal cancer Neg Hx    Stomach cancer Neg Hx      Physical Exam: General:   Alert,  well-nourished, pleasant and cooperative in NAD Head:  Normocephalic and atraumatic. Eyes:  Sclera clear, no icterus.   Conjunctiva pink. Mouth:  No deformity or lesions.   Neck:  Supple; no masses or thyromegaly. Lungs:  Clear  throughout to auscultation.   No wheezes. Heart:  Regular rate and rhythm; no murmurs. Abdomen:  Soft, non-tender, nondistended, normal bowel sounds, no rebound or guarding.  Msk:  Symmetrical. No boney deformities LAD: No inguinal or umbilical LAD Extremities:  No clubbing or edema. Neurologic:  Alert and  oriented x4;  grossly nonfocal Skin:  No obvious rash or bruise. Psych:  Alert and cooperative. Normal mood and affect.     Studies/Results: No results found.    Jehan Bonano L. Tarri Glenn, MD, MPH 04/21/2022, 7:44 AM

## 2022-04-21 NOTE — Progress Notes (Signed)
Pt's states no medical or surgical changes since previsit or office visit. 

## 2022-04-21 NOTE — Progress Notes (Signed)
PT taken to PACU. Monitors in place. VSS. Report given to RN. 

## 2022-04-21 NOTE — Op Note (Signed)
[facility] Patient Name: Vickie [name] Procedure Date: 04/21/2022 8:00 AM MRN: [id number] Endoscopist: Thornton Park MD, MD Age: 51 Referring MD:  Date of Birth: June 08, 1971 Gender: Female Account #: [id number] Procedure:                Colonoscopy Indications:              Screening for colorectal malignant neoplasm, This                            is the patient's first colonoscopy Medicines:                Monitored Anesthesia Care Procedure:                Pre-Anesthesia Assessment:                           - Prior to the procedure, a History and Physical                            was performed, and patient medications and                            allergies were reviewed. The patient's tolerance of                            previous anesthesia was also reviewed. The risks                            and benefits of the procedure and the sedation                            options and risks were discussed with the patient.                            All questions were answered, and informed consent                            was obtained. Prior Anticoagulants: The patient has                            taken no previous anticoagulant or antiplatelet                            agents. ASA Grade Assessment: I - A normal, healthy                            patient. After reviewing the risks and benefits,                            the patient was deemed in satisfactory condition to                            undergo the procedure.  After obtaining informed consent, the colonoscope                            was passed under direct vision. Throughout the                            procedure, the patient's blood pressure, pulse, and                            oxygen saturations were monitored continuously. The                            PCF-HQ190L Colonoscope was introduced through the                            anus and advanced to the 3 cm into  the ileum. The                            colonoscopy was performed without difficulty. The                            patient tolerated the procedure well. The quality                            of the bowel preparation was excellent. The                            terminal ileum, ileocecal valve, appendiceal                            orifice, and rectum were photographed. Scope In: 8:02:31 AM Scope Out: 8:19:48 AM Scope Withdrawal Time: 0 hours 13 minutes 42 seconds  Total Procedure Duration: 0 hours 17 minutes 17 seconds  Findings:                 The perianal and digital rectal examinations were                            normal.                           The entire examined colon appeared normal on direct                            and retroflexion views. Complications:            No immediate complications. Estimated Blood Loss:     Estimated blood loss: none. Impression:               - The entire examined colon is normal on direct and                            retroflexion views.                           - No specimens collected. Recommendation:           -  Patient has a contact number available for                            emergencies. The signs and symptoms of potential                            delayed complications were discussed with the                            patient. Return to normal activities tomorrow.                            Written discharge instructions were provided to the                            patient.                           - Resume previous diet.                           - Continue present medications.                           - Repeat colonoscopy in 10 years for surveillance,                            earlier with new symptoms.                           - Emerging evidence supports eating a diet of                            fruits, vegetables, grains, calcium, and yogurt                            while reducing red meat and alcohol may  reduce the                            risk of colon cancer.                           - Thank you for allowing me to be involved in your                            colon cancer prevention. Thornton Park MD, MD 04/21/2022 8:22:51 AM This report has been signed electronically.

## 2022-04-21 NOTE — Patient Instructions (Signed)
HANDOUTS PROVIDED ON:   Your next colonoscopy should occur in 10 years.  Our office will reach out near that time to schedule your next procedure.    You may resume your previous diet and medication schedule.  Thank you for allowing [place] to care for you today!!!   YOU HAD AN ENDOSCOPIC PROCEDURE TODAY AT [facility]:   Refer to the procedure report that was given to you for any specific questions about what was found during the examination.  If the procedure report does not answer your questions, please call your gastroenterologist to clarify.  If you requested that your care partner not be given the details of your procedure findings, then the procedure report has been included in a sealed envelope for you to review at your convenience later.  YOU SHOULD EXPECT: Some feelings of bloating in the abdomen. Passage of more gas than usual.  Walking can help get rid of the air that was put into your GI tract during the procedure and reduce the bloating. If you had a lower endoscopy (such as a colonoscopy or flexible sigmoidoscopy) you may notice spotting of blood in your stool or on the toilet paper. If you underwent a bowel prep for your procedure, you may not have a normal bowel movement for a few days.  Please Note:  You might notice some irritation and congestion in your nose or some drainage.  This is from the oxygen used during your procedure.  There is no need for concern and it should clear up in a day or so.  SYMPTOMS TO REPORT IMMEDIATELY:  Following lower endoscopy (colonoscopy or flexible sigmoidoscopy):  Excessive amounts of blood in the stool  Significant tenderness or worsening of abdominal pains  Swelling of the abdomen that is new, acute  Fever of 100F or higher  For urgent or emergent issues, a gastroenterologist can be reached at any hour by calling [phone]. Do not use MyChart messaging for urgent concerns.    DIET:  We do recommend a small meal at  first, but then you may proceed to your regular diet.  Drink plenty of fluids but you should avoid alcoholic beverages for 24 hours.  ACTIVITY:  You should plan to take it easy for the rest of today and you should NOT DRIVE or use heavy machinery until tomorrow (because of the sedation medicines used during the test).    FOLLOW UP: Our staff will call the number listed on your records the next business day following your procedure.  We will call around 7:15- 8:00 am to check on you and address any questions or concerns that you may have regarding the information given to you following your procedure. If we do not reach you, we will leave a message.  If you develop any symptoms (ie: fever, flu-like symptoms, shortness of breath, cough etc.) before then, please call [phone].  If you test positive for Covid 19 in the 2 weeks post procedure, please call and report this information to [place].    If any biopsies were taken you will be contacted by phone or by letter within the next 1-3 weeks.  Please call us at [phone] if you have not heard about the biopsies in 3 weeks.    SIGNATURES/CONFIDENTIALITY: You and/or your care partner have signed paperwork which will be entered into your electronic medical record.  These signatures attest to the fact that that the information above on your After Visit Summary has been reviewed and  is understood.  Full responsibility of the confidentiality of this discharge information lies with you and/or your care-partner.

## 2022-04-24 ENCOUNTER — Telehealth: Payer: Self-pay

## 2022-04-24 NOTE — Telephone Encounter (Signed)
  Follow up Call-     04/21/2022    7:24 AM  Call back number  Post procedure Call Back phone  # [phone]  Permission to leave phone message Yes     Patient questions:  Do you have a fever, pain , or abdominal swelling? No. Pain Score  0 *  Have you tolerated food without any problems? Yes.    Have you been able to return to your normal activities? Yes.    Do you have any questions about your discharge instructions: Diet   No. Medications  No. Follow up visit  No.  Do you have questions or concerns about your Care? No.  Actions: * If pain score is 4 or above: No action needed, pain <4.

## 2022-06-08 DIAGNOSIS — L821 Other seborrheic keratosis: Secondary | ICD-10-CM | POA: Diagnosis not present

## 2022-06-08 DIAGNOSIS — D2262 Melanocytic nevi of left upper limb, including shoulder: Secondary | ICD-10-CM | POA: Diagnosis not present

## 2022-06-08 DIAGNOSIS — D2261 Melanocytic nevi of right upper limb, including shoulder: Secondary | ICD-10-CM | POA: Diagnosis not present

## 2022-06-08 DIAGNOSIS — D1801 Hemangioma of skin and subcutaneous tissue: Secondary | ICD-10-CM | POA: Diagnosis not present

## 2022-06-08 DIAGNOSIS — L814 Other melanin hyperpigmentation: Secondary | ICD-10-CM | POA: Diagnosis not present

## 2022-06-08 DIAGNOSIS — D2271 Melanocytic nevi of right lower limb, including hip: Secondary | ICD-10-CM | POA: Diagnosis not present

## 2022-06-08 DIAGNOSIS — D2272 Melanocytic nevi of left lower limb, including hip: Secondary | ICD-10-CM | POA: Diagnosis not present

## 2022-06-08 DIAGNOSIS — D225 Melanocytic nevi of trunk: Secondary | ICD-10-CM | POA: Diagnosis not present

## 2022-06-29 ENCOUNTER — Other Ambulatory Visit (HOSPITAL_COMMUNITY): Payer: Self-pay

## 2022-06-29 DIAGNOSIS — Z1231 Encounter for screening mammogram for malignant neoplasm of breast: Secondary | ICD-10-CM | POA: Diagnosis not present

## 2022-06-29 DIAGNOSIS — Z1329 Encounter for screening for other suspected endocrine disorder: Secondary | ICD-10-CM | POA: Diagnosis not present

## 2022-06-29 DIAGNOSIS — Z13228 Encounter for screening for other metabolic disorders: Secondary | ICD-10-CM | POA: Diagnosis not present

## 2022-06-29 DIAGNOSIS — Z13220 Encounter for screening for lipoid disorders: Secondary | ICD-10-CM | POA: Diagnosis not present

## 2022-06-29 DIAGNOSIS — Z6824 Body mass index (BMI) 24.0-24.9, adult: Secondary | ICD-10-CM | POA: Diagnosis not present

## 2022-06-29 DIAGNOSIS — Z131 Encounter for screening for diabetes mellitus: Secondary | ICD-10-CM | POA: Diagnosis not present

## 2022-06-29 DIAGNOSIS — Z01419 Encounter for gynecological examination (general) (routine) without abnormal findings: Secondary | ICD-10-CM | POA: Diagnosis not present

## 2022-06-29 MED ORDER — NORGESTIM-ETH ESTRAD TRIPHASIC 0.18/0.215/0.25 MG-35 MCG PO TABS
1.0000 | ORAL_TABLET | Freq: Every day | ORAL | 3 refills | Status: DC
  Filled 2022-06-29: qty 84, 84d supply, fill #0
  Filled 2022-09-22: qty 84, 84d supply, fill #1
  Filled 2022-12-14: qty 84, 84d supply, fill #2
  Filled 2023-03-12: qty 84, 84d supply, fill #3

## 2022-07-04 ENCOUNTER — Other Ambulatory Visit: Payer: Self-pay | Admitting: Obstetrics & Gynecology

## 2022-07-04 ENCOUNTER — Other Ambulatory Visit (HOSPITAL_COMMUNITY): Payer: Self-pay

## 2022-07-04 DIAGNOSIS — R928 Other abnormal and inconclusive findings on diagnostic imaging of breast: Secondary | ICD-10-CM

## 2022-07-19 ENCOUNTER — Ambulatory Visit
Admission: RE | Admit: 2022-07-19 | Discharge: 2022-07-19 | Disposition: A | Discharge status: Home / Self Care | Payer: 59 | Source: Ambulatory Visit | Attending: Obstetrics & Gynecology | Admitting: Obstetrics & Gynecology

## 2022-07-19 ENCOUNTER — Ambulatory Visit: Payer: 59

## 2022-07-19 DIAGNOSIS — R92332 Mammographic heterogeneous density, left breast: Secondary | ICD-10-CM | POA: Diagnosis not present

## 2022-07-19 DIAGNOSIS — R928 Other abnormal and inconclusive findings on diagnostic imaging of breast: Secondary | ICD-10-CM

## 2022-09-26 ENCOUNTER — Other Ambulatory Visit (HOSPITAL_COMMUNITY): Payer: Self-pay

## 2022-10-05 DIAGNOSIS — D3131 Benign neoplasm of right choroid: Secondary | ICD-10-CM | POA: Diagnosis not present

## 2022-12-14 ENCOUNTER — Other Ambulatory Visit (HOSPITAL_BASED_OUTPATIENT_CLINIC_OR_DEPARTMENT_OTHER): Payer: Self-pay

## 2022-12-29 NOTE — Progress Notes (Unsigned)
  [facility] [address] [place] [address] Phone: [phone] Subjective:   [name], am serving as a scribe for Dr. Antoine Primas.  I'm seeing this patient by the request  of:  Patient, No Pcp Per  CC: Back and neck pain  [id number]  Vickie [name] is a 52 y.o. female coming in with complaint of back and neck pain. OMT August 2023. Patient states that is having R thumb pain. Pain over illiac crest of R hip and over GT.   Medications patient has been prescribed: None         Reviewed prior external information including notes and imaging from previsou exam, outside providers and external EMR if available.   As well as notes that were available from care everywhere and other healthcare systems.  Past medical history, social, surgical and family history all reviewed in electronic medical record.  No pertanent information unless stated regarding to the chief complaint.   No past medical history on file.  Allergies  Allergen Reactions   Penicillins Swelling   Sulfa Antibiotics Itching     Review of Systems:  No headache, visual changes, nausea, vomiting, diarrhea, constipation, dizziness, abdominal pain, skin rash, fevers, chills, night sweats, weight loss, swollen lymph nodes, body aches, joint swelling, chest pain, shortness of breath, mood changes. POSITIVE muscle aches  Objective  Blood pressure 124/78, height 5\' 1"  (1.549 m).   General: No apparent distress alert and oriented x3 mood and affect normal, dressed appropriately.  HEENT: Pupils equal, extraocular movements intact  Respiratory: Patient's speak in full sentences and does not appear short of breath  Cardiovascular: No lower extremity edema, non tender, no erythema  Right hand exam shows the patient does have laxity of the UCL noted.  Does have an endpoint though it feels like still.  Patient is low back does have some tightness noted in the quadratus lumborum,  mild in the gluteal area as well.  Limited muscular skeletal ultrasound was performed and interpreted by Antoine Primas, M  Limited ultrasound does show that patient does have some hypoechoic changes of the deep fibers of the UCL.  On dynamic testing there is some gapping noted.  Seems to be high-grade more than 50%.    Assessment and Plan:  Low back pain Quadratus lumborum spasm noted.  Given some of this.  Discussed we could consider the possibility of x-rays if necessary.  Increase activity slowly.  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  Discussed with patient about different treatment options.  Patient has elected to try the thumb spica.  Discussed which activities to do and which ones to avoid.  Increase activity slowly.  Follow-up with me again in 6 to 8 weeks at that time we will get patient out of the thumb spica ankle.        The above documentation has been reviewed and is accurate and complete Judi Saa, DO         Note: This dictation was prepared with Dragon dictation along with smaller phrase technology. Any transcriptional errors that result from this process are unintentional.

## 2023-01-03 ENCOUNTER — Other Ambulatory Visit: Payer: Self-pay

## 2023-01-03 ENCOUNTER — Ambulatory Visit (INDEPENDENT_AMBULATORY_CARE_PROVIDER_SITE_OTHER): Payer: Commercial Managed Care - PPO | Admitting: Family Medicine

## 2023-01-03 ENCOUNTER — Encounter: Payer: Self-pay | Admitting: Family Medicine

## 2023-01-03 VITALS — BP 124/78 | Ht 61.0 in

## 2023-01-03 DIAGNOSIS — S63649A Sprain of metacarpophalangeal joint of unspecified thumb, initial encounter: Secondary | ICD-10-CM | POA: Insufficient documentation

## 2023-01-03 DIAGNOSIS — M79644 Pain in right finger(s): Secondary | ICD-10-CM | POA: Diagnosis not present

## 2023-01-03 DIAGNOSIS — M545 Low back pain, unspecified: Secondary | ICD-10-CM

## 2023-01-03 DIAGNOSIS — S63641A Sprain of metacarpophalangeal joint of right thumb, initial encounter: Secondary | ICD-10-CM | POA: Diagnosis not present

## 2023-01-03 NOTE — Assessment & Plan Note (Signed)
Continues to have a partial rupture noted.  Discussed with patient about different treatment options.  Patient has elected to try the thumb spica.  Discussed which activities to do and which ones to avoid.  Increase activity slowly.  Follow-up with me again in 6 to 8 weeks at that time we will get patient out of the thumb spica ankle.

## 2023-01-03 NOTE — Patient Instructions (Signed)
Brace 23 hours a day Stretches See me again in 3 weeks (just stop by)

## 2023-01-03 NOTE — Assessment & Plan Note (Signed)
Quadratus lumborum spasm noted.  Given some of this.  Discussed we could consider the possibility of x-rays if necessary.  Increase activity slowly.  Follow-up again in 6 to 8 weeks

## 2023-05-29 ENCOUNTER — Other Ambulatory Visit (HOSPITAL_BASED_OUTPATIENT_CLINIC_OR_DEPARTMENT_OTHER): Payer: Self-pay

## 2023-05-29 MED ORDER — NORGESTIM-ETH ESTRAD TRIPHASIC 0.18/0.215/0.25 MG-35 MCG PO TABS
1.0000 | ORAL_TABLET | Freq: Every day | ORAL | 0 refills | Status: AC
  Filled 2023-05-29: qty 84, 84d supply, fill #0

## 2023-06-07 ENCOUNTER — Other Ambulatory Visit (HOSPITAL_BASED_OUTPATIENT_CLINIC_OR_DEPARTMENT_OTHER): Payer: Self-pay

## 2023-06-07 MED ORDER — TRETINOIN 0.05 % EX CREA
1.0000 "application " | TOPICAL_CREAM | Freq: Every day | CUTANEOUS | 3 refills | Status: AC
  Filled 2023-06-07: qty 45, 30d supply, fill #0

## 2023-07-26 ENCOUNTER — Other Ambulatory Visit (HOSPITAL_BASED_OUTPATIENT_CLINIC_OR_DEPARTMENT_OTHER): Payer: Self-pay

## 2023-07-26 DIAGNOSIS — Z01419 Encounter for gynecological examination (general) (routine) without abnormal findings: Secondary | ICD-10-CM | POA: Diagnosis not present

## 2023-07-26 DIAGNOSIS — Z1151 Encounter for screening for human papillomavirus (HPV): Secondary | ICD-10-CM | POA: Diagnosis not present

## 2023-07-26 DIAGNOSIS — Z1231 Encounter for screening mammogram for malignant neoplasm of breast: Secondary | ICD-10-CM | POA: Diagnosis not present

## 2023-07-26 DIAGNOSIS — Z6825 Body mass index (BMI) 25.0-25.9, adult: Secondary | ICD-10-CM | POA: Diagnosis not present

## 2023-07-26 DIAGNOSIS — Z124 Encounter for screening for malignant neoplasm of cervix: Secondary | ICD-10-CM | POA: Diagnosis not present

## 2023-07-26 MED ORDER — CLOBETASOL PROPIONATE 0.05 % EX OINT
TOPICAL_OINTMENT | CUTANEOUS | 1 refills | Status: AC
  Filled 2023-07-26: qty 30, 15d supply, fill #0

## 2023-07-27 ENCOUNTER — Other Ambulatory Visit (HOSPITAL_BASED_OUTPATIENT_CLINIC_OR_DEPARTMENT_OTHER): Payer: Self-pay

## 2023-07-30 ENCOUNTER — Other Ambulatory Visit (HOSPITAL_BASED_OUTPATIENT_CLINIC_OR_DEPARTMENT_OTHER): Payer: Self-pay

## 2023-08-30 DIAGNOSIS — D2262 Melanocytic nevi of left upper limb, including shoulder: Secondary | ICD-10-CM | POA: Diagnosis not present

## 2023-08-30 DIAGNOSIS — L814 Other melanin hyperpigmentation: Secondary | ICD-10-CM | POA: Diagnosis not present

## 2023-08-30 DIAGNOSIS — D2271 Melanocytic nevi of right lower limb, including hip: Secondary | ICD-10-CM | POA: Diagnosis not present

## 2023-08-30 DIAGNOSIS — D225 Melanocytic nevi of trunk: Secondary | ICD-10-CM | POA: Diagnosis not present

## 2023-08-30 DIAGNOSIS — Z419 Encounter for procedure for purposes other than remedying health state, unspecified: Secondary | ICD-10-CM | POA: Diagnosis not present

## 2023-09-10 NOTE — Progress Notes (Unsigned)
[name] [address] [place] [address] Phone: [phone] Subjective:   Vickie [name], am serving as a scribe for Dr. Antoine Primas.  I'm seeing this patient by the request  of:  Patient, No Pcp Per  CC: Low back pain  [id number]  01/03/2023 Quadratus lumborum spasm noted.  Given some of this.  Discussed we could consider the possibility of x-rays if necessary.  Increase activity slowly.  Follow-up again in 6 to 8 weeks     Update 09/11/2023 Vickie [name] is a 53 y.o. female coming in with complaint of lumbar radiculopathy.  Patient was seen back in May for some low back pain that seem to be more of a quadratus lumborum tightness.  Unfortunately has had more intermittent sciatica symptoms now.  Patient states that she has been having numbness in L glute and nerve pain in L lower leg. Typically with sleeping.    Patient previously had x-rays in 2020 of the lumbar spine showing no significant bony abnormality.  These were independently visualized by me again today.  No past medical history on file. Past Surgical History:  Procedure Laterality Date   BREAST REDUCTION SURGERY     HAND SURGERY Right    TONSILLECTOMY     Social History   Socioeconomic History   Marital status: Married    Spouse name: Not on file   Number of children: Not on file   Years of education: Not on file   Highest education level: Not on file  Occupational History   Not on file  Tobacco Use   Smoking status: Never   Smokeless tobacco: Never  Vaping Use   Vaping status: Never Used  Substance and Sexual Activity   Alcohol use: Yes    Comment: social   Drug use: Never   Sexual activity: Not on file  Other Topics Concern   Not on file  Social History Narrative   Not on file   Social Drivers of Health   Financial Resource Strain: Not on file  Food Insecurity: Not on file  Transportation Needs: Not on file  Physical Activity: Not on file  Stress:  Not on file  Social Connections: Not on file   Allergies  Allergen Reactions   Penicillins Swelling   Sulfa Antibiotics Itching   Family History  Problem Relation Age of Onset   Colon cancer Neg Hx    Colon polyps Neg Hx    Esophageal cancer Neg Hx    Rectal cancer Neg Hx    Stomach cancer Neg Hx     Current Outpatient Medications (Endocrine & Metabolic):    Norgestimate-Ethinyl Estradiol Triphasic (TRI-VYLIBRA) 0.18/0.215/0.25 MG-35 MCG tablet, Take 1 tablet by mouth daily.   Norgestimate-Ethinyl Estradiol Triphasic 0.18/0.215/0.25 MG-35 MCG tablet, TAKE 1 TABLET BY MOUTH ONCE DAILY (Patient not taking: Reported on 04/21/2022)      Current Outpatient Medications (Other):    clobetasol ointment (TEMOVATE) 0.05 %, Apply a thin layer to affected area(s) twice daily   tiZANidine (ZANAFLEX) 2 MG tablet, Take 1 tablet (2 mg total) by mouth at bedtime.   tretinoin (RETIN-A) 0.05 % cream, Apply pea sized amount to face (topically) at bedtime as tolerated    Review of Systems:  No headache, visual changes, nausea, vomiting, diarrhea, constipation, dizziness, abdominal pain, skin rash, fevers, chills, night sweats, weight loss, swollen lymph nodes, joint swelling, chest pain, shortness of breath, mood changes. POSITIVE muscle aches, body aches  Objective  Blood pressure  118/82, pulse (!) 59, height 5\' 1"  (1.549 m), SpO2 98%.   General: No apparent distress alert and oriented x3 mood and affect normal, dressed appropriately.  HEENT: Pupils equal, extraocular movements intact  Respiratory: Patient's speak in full sentences and does not appear short of breath  Cardiovascular: No lower extremity edema, non tender, no erythema  Low back exam shows light tenderness noted of the greater trochanteric area on the left side and the tightness in the piriformis.  Great range of motion noted.  Some mild weakness to hip abductor noted.    Impression and Recommendations:    Low back pain Back  pain with some radicular symptoms from time to time.  Discussed with patient that I do think she is doing relatively well overall.  X-rays previously did not show any significant bony abnormality.  We discussed different medications which patient would not want to do significantly but was given a Zanaflex to see how patient responds at a very low dose.  Follow-up again in 6 to 8 weeks    The above documentation has been reviewed and is accurate and complete Judi Saa, DO

## 2023-09-11 ENCOUNTER — Other Ambulatory Visit (HOSPITAL_BASED_OUTPATIENT_CLINIC_OR_DEPARTMENT_OTHER): Payer: Self-pay

## 2023-09-11 ENCOUNTER — Ambulatory Visit: Payer: Commercial Managed Care - PPO | Admitting: Family Medicine

## 2023-09-11 VITALS — BP 118/82 | HR 59 | Ht 61.0 in

## 2023-09-11 DIAGNOSIS — M545 Low back pain, unspecified: Secondary | ICD-10-CM | POA: Diagnosis not present

## 2023-09-11 MED ORDER — TIZANIDINE HCL 2 MG PO TABS
2.0000 mg | ORAL_TABLET | Freq: Every day | ORAL | 0 refills | Status: AC
  Filled 2023-09-11: qty 30, 30d supply, fill #0

## 2023-09-11 NOTE — Assessment & Plan Note (Signed)
Back pain with some radicular symptoms from time to time.  Discussed with patient that I do think she is doing relatively well overall.  X-rays previously did not show any significant bony abnormality.  We discussed different medications which patient would not want to do significantly but was given a Zanaflex to see how patient responds at a very low dose.  Follow-up again in 6 to 8 weeks

## 2023-10-11 ENCOUNTER — Ambulatory Visit: Payer: Commercial Managed Care - PPO | Admitting: Family Medicine

## 2023-12-27 NOTE — Progress Notes (Unsigned)
 [facility] [address] [place] [address] Phone: [phone] Subjective:   [name], am serving as a scribe for Dr. Ronnell Coins.  I'm seeing this patient by the request  of:  Patient, No Pcp Per  CC: Buttock pain  [id number]  Vickie [name] is a 53 y.o. female coming in with complaint of L hip pain. Patient states continues to have more buttocks pain, left-sided, seems to be worse with certain activities.  Denies any other weakness, no significant radiation of pain, can wake her up at night.      No past medical history on file. Past Surgical History:  Procedure Laterality Date   BREAST REDUCTION SURGERY     HAND SURGERY Right    TONSILLECTOMY     Social History   Socioeconomic History   Marital status: Married    Spouse name: Not on file   Number of children: Not on file   Years of education: Not on file   Highest education level: Not on file  Occupational History   Not on file  Tobacco Use   Smoking status: Never   Smokeless tobacco: Never  Vaping Use   Vaping status: Never Used  Substance and Sexual Activity   Alcohol use: Yes    Comment: social   Drug use: Never   Sexual activity: Not on file  Other Topics Concern   Not on file  Social History Narrative   Not on file   Social Drivers of Health   Financial Resource Strain: Not on file  Food Insecurity: Not on file  Transportation Needs: Not on file  Physical Activity: Not on file  Stress: Not on file  Social Connections: Not on file   Allergies  Allergen Reactions   Penicillins Swelling   Sulfa Antibiotics Itching   Family History  Problem Relation Age of Onset   Colon cancer Neg Hx    Colon polyps Neg Hx    Esophageal cancer Neg Hx    Rectal cancer Neg Hx    Stomach cancer Neg Hx     Current Outpatient Medications (Endocrine & Metabolic):    Norgestimate -Ethinyl Estradiol Triphasic (TRI-VYLIBRA ) 0.18/0.215/0.25 MG-35 MCG tablet, Take 1  tablet by mouth daily.   Norgestimate -Ethinyl Estradiol Triphasic 0.18/0.215/0.25 MG-35 MCG tablet, TAKE 1 TABLET BY MOUTH ONCE DAILY (Patient not taking: Reported on 04/21/2022)      Current Outpatient Medications (Other):    clobetasol  ointment (TEMOVATE ) 0.05 %, Apply a thin layer to affected area(s) twice daily   tiZANidine  (ZANAFLEX ) 2 MG tablet, Take 1 tablet (2 mg total) by mouth at bedtime.   tretinoin  (RETIN-A ) 0.05 % cream, Apply pea sized amount to face (topically) at bedtime as tolerated   Reviewed prior external information including notes and imaging from  primary care provider As well as notes that were available from care everywhere and other healthcare systems.  Past medical history, social, surgical and family history all reviewed in electronic medical record.  No pertanent information unless stated regarding to the chief complaint.   Review of Systems:  No headache, visual changes, nausea, vomiting, diarrhea, constipation, dizziness, abdominal pain, skin rash, fevers, chills, night sweats, weight loss, swollen lymph nodes, body aches, joint swelling, chest pain, shortness of breath, mood changes. POSITIVE muscle aches  Objective  There were no vitals taken for this visit.   General: No apparent distress alert and oriented x3 mood and affect normal, dressed appropriately.  HEENT: Pupils equal, extraocular movements intact  Respiratory: Patient's speak in full sentences and does not appear short of breath  Cardiovascular: No lower extremity edema, non tender, no erythema  Low back exam shows mild tightness.  Severe tenderness to palpation over the greater trochanteric area on the left side.  Mild positive Veldon German.   Procedure: Real-time Ultrasound Guided Injection of left  greater trochanteric bursitis secondary to patient's body habitus Device: GE Logiq Q7  Ultrasound guided injection is preferred based studies that show increased duration, increased effect, greater  accuracy, decreased procedural pain, increased response rate, and decreased cost with ultrasound guided versus blind injection.  Verbal informed consent obtained.  Time-out conducted.  Noted no overlying erythema, induration, or other signs of local infection.  Skin prepped in a sterile fashion.  Local anesthesia: Topical Ethyl chloride.  With sterile technique and under real time ultrasound guidance:  Greater trochanteric area was visualized and patient's bursa was noted. A 22-gauge 3 inch needle was inserted and 4 cc of 0.5% Marcaine and 1 cc of Kenalog 40 mg/dL was injected. Pictures taken and saved Completed without difficulty  Pain immediately resolved suggesting accurate placement of the medication.  Advised to call if fevers/chills, erythema, induration, drainage, or persistent bleeding.  Images permanently stored  Impression: Technically successful ultrasound guided injection.    Impression and Recommendations:    The above documentation has been reviewed and is accurate and complete Janelly Switalski M Dandrea Widdowson, DO

## 2023-12-28 ENCOUNTER — Ambulatory Visit: Admitting: Family Medicine

## 2023-12-28 ENCOUNTER — Encounter: Payer: Self-pay | Admitting: Family Medicine

## 2023-12-28 ENCOUNTER — Other Ambulatory Visit: Payer: Self-pay

## 2023-12-28 VITALS — BP 120/82 | HR 67 | Ht 61.0 in

## 2023-12-28 DIAGNOSIS — M25551 Pain in right hip: Secondary | ICD-10-CM

## 2023-12-28 DIAGNOSIS — M25552 Pain in left hip: Secondary | ICD-10-CM | POA: Diagnosis not present

## 2023-12-28 DIAGNOSIS — M7062 Trochanteric bursitis, left hip: Secondary | ICD-10-CM

## 2023-12-28 NOTE — Assessment & Plan Note (Signed)
 Patient given injection and tolerated the procedure well, discussed icing regimen and home exercises, discussed which activities to do and which ones to avoid.  Increase activity slowly.  Follow-up again in 6 to 8 weeks worsening pain need to consider further workup for lumbar radiculopathy.

## 2023-12-28 NOTE — Patient Instructions (Signed)
Injected hip today 

## 2024-01-04 ENCOUNTER — Other Ambulatory Visit (HOSPITAL_BASED_OUTPATIENT_CLINIC_OR_DEPARTMENT_OTHER): Payer: Self-pay

## 2024-01-04 ENCOUNTER — Other Ambulatory Visit: Payer: Self-pay

## 2024-01-04 MED ORDER — DOXYCYCLINE HYCLATE 100 MG PO TABS
100.0000 mg | ORAL_TABLET | Freq: Two times a day (BID) | ORAL | 0 refills | Status: AC
  Filled 2024-01-04: qty 20, 10d supply, fill #0

## 2024-02-07 ENCOUNTER — Other Ambulatory Visit (HOSPITAL_BASED_OUTPATIENT_CLINIC_OR_DEPARTMENT_OTHER): Payer: Self-pay

## 2024-02-07 ENCOUNTER — Other Ambulatory Visit (HOSPITAL_COMMUNITY): Payer: Self-pay

## 2024-02-07 ENCOUNTER — Other Ambulatory Visit: Payer: Self-pay | Admitting: Family Medicine

## 2024-02-07 MED ORDER — ALPRAZOLAM 0.25 MG PO TABS
0.2500 mg | ORAL_TABLET | Freq: Three times a day (TID) | ORAL | 0 refills | Status: AC | PRN
  Filled 2024-02-07: qty 15, 5d supply, fill #0

## 2024-02-07 NOTE — Progress Notes (Signed)
 Patient will be traveling, has difficulty with sleeping on the plane.  Does not use benzodiazepines regularly.  Will give her some for the flight.  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  Will send in short course.

## 2024-09-11 ENCOUNTER — Other Ambulatory Visit (HOSPITAL_BASED_OUTPATIENT_CLINIC_OR_DEPARTMENT_OTHER): Payer: Self-pay

## 2024-09-11 MED ORDER — TRETINOIN 0.05 % EX CREA
TOPICAL_CREAM | CUTANEOUS | 3 refills | Status: AC
  Filled 2024-09-11: qty 45, 30d supply, fill #0
